# Patient Record
Sex: Female | Born: 1970
Health system: Southern US, Community
[De-identification: ages and names within clinical notes are randomized; demographics above are authoritative.]

## PROBLEM LIST (undated history)

## (undated) DIAGNOSIS — U071 COVID-19: Secondary | ICD-10-CM

## (undated) DIAGNOSIS — F53 Postpartum depression: Secondary | ICD-10-CM

## (undated) DIAGNOSIS — D649 Anemia, unspecified: Secondary | ICD-10-CM

## (undated) DIAGNOSIS — L719 Rosacea, unspecified: Secondary | ICD-10-CM

## (undated) HISTORY — DX: Postpartum depression: F53.0

## (undated) HISTORY — PX: APPENDECTOMY: SHX54

## (undated) HISTORY — PX: TUBAL LIGATION: SHX77

## (undated) HISTORY — DX: Rosacea, unspecified: L71.9

## (undated) HISTORY — DX: Anemia, unspecified: D64.9

## (undated) HISTORY — PX: HERNIA REPAIR: SHX51

---

## 2019-09-04 ENCOUNTER — Ambulatory Visit: Payer: Self-pay | Attending: Internal Medicine

## 2019-09-04 DIAGNOSIS — Z23 Encounter for immunization: Secondary | ICD-10-CM | POA: Insufficient documentation

## 2019-09-04 NOTE — Progress Notes (Signed)
   Covid-19 Vaccination Clinic  Name:  Tara Washington    MRN: [id number] DOB: 04/20/1971  09/04/2019  [name] was observed post Covid-19 immunization for 15 minutes without incident. She was provided with Vaccine Information Sheet and instruction to access the V-Safe system.   [name] was instructed to call 911 with any severe reactions post vaccine: Marland Kitchen Difficulty breathing  . [name]  . [name]  . [name]  . Dizziness and weakness   Immunizations Administered    Name Date Dose VIS Date Route   Pfizer COVID-19 Vaccine 09/04/2019  3:22 PM 0.3 mL 06/11/2019 Intramuscular   Manufacturer: [place]   Lot: [id number]   [place]: [id number]

## 2019-09-25 ENCOUNTER — Ambulatory Visit: Payer: Self-pay | Attending: Internal Medicine

## 2019-09-25 DIAGNOSIS — Z23 Encounter for immunization: Secondary | ICD-10-CM

## 2019-09-25 NOTE — Progress Notes (Signed)
   Covid-19 Vaccination Clinic  Name:  Tara Washington    MRN: [id number] DOB: [birth year]  09/25/2019  [name] was observed post Covid-19 immunization for 15 minutes without incident. She was provided with Vaccine Information Sheet and instruction to access the V-Safe system.   [name] was instructed to call 911 with any severe reactions post vaccine: Marland Kitchen Difficulty breathing  . [name]  . [name]  . [name]  . Dizziness and weakness   Immunizations Administered    Name Date Dose VIS Date Route   Pfizer COVID-19 Vaccine 09/25/2019 10:52 AM 0.3 mL 06/11/2019 Intramuscular   Manufacturer: [place]   Lot: [id number]   [place]: [id number]

## 2019-10-05 ENCOUNTER — Ambulatory Visit: Payer: Self-pay

## 2020-04-19 ENCOUNTER — Other Ambulatory Visit: Payer: Self-pay

## 2020-04-19 DIAGNOSIS — Z20822 Contact with and (suspected) exposure to covid-19: Secondary | ICD-10-CM

## 2020-04-20 LAB — NOVEL CORONAVIRUS, NAA: SARS-CoV-2, NAA: NOT DETECTED

## 2020-04-20 LAB — SARS-COV-2, NAA 2 DAY TAT

## 2020-09-15 ENCOUNTER — Inpatient Hospital Stay (HOSPITAL_BASED_OUTPATIENT_CLINIC_OR_DEPARTMENT_OTHER)
Admission: EM | Admit: 2020-09-15 | Discharge: 2020-09-21 | DRG: 342 | Disposition: A | Discharge status: Home / Self Care | Payer: Self-pay | Attending: Surgery | Admitting: Surgery

## 2020-09-15 ENCOUNTER — Other Ambulatory Visit: Payer: Self-pay

## 2020-09-15 ENCOUNTER — Encounter (HOSPITAL_BASED_OUTPATIENT_CLINIC_OR_DEPARTMENT_OTHER): Payer: Self-pay

## 2020-09-15 DIAGNOSIS — K660 Peritoneal adhesions (postprocedural) (postinfection): Secondary | ICD-10-CM | POA: Diagnosis present

## 2020-09-15 DIAGNOSIS — R3911 Hesitancy of micturition: Secondary | ICD-10-CM | POA: Diagnosis not present

## 2020-09-15 DIAGNOSIS — K353 Acute appendicitis with localized peritonitis, without perforation or gangrene: Principal | ICD-10-CM | POA: Diagnosis present

## 2020-09-15 DIAGNOSIS — K3580 Unspecified acute appendicitis: Secondary | ICD-10-CM | POA: Diagnosis present

## 2020-09-15 DIAGNOSIS — E739 Lactose intolerance, unspecified: Secondary | ICD-10-CM | POA: Diagnosis present

## 2020-09-15 DIAGNOSIS — Z9049 Acquired absence of other specified parts of digestive tract: Secondary | ICD-10-CM

## 2020-09-15 DIAGNOSIS — Z888 Allergy status to other drugs, medicaments and biological substances status: Secondary | ICD-10-CM

## 2020-09-15 DIAGNOSIS — Z880 Allergy status to penicillin: Secondary | ICD-10-CM

## 2020-09-15 DIAGNOSIS — Z20822 Contact with and (suspected) exposure to covid-19: Secondary | ICD-10-CM | POA: Diagnosis present

## 2020-09-15 DIAGNOSIS — D62 Acute posthemorrhagic anemia: Secondary | ICD-10-CM | POA: Diagnosis not present

## 2020-09-15 HISTORY — DX: COVID-19: U07.1

## 2020-09-15 LAB — COMPREHENSIVE METABOLIC PANEL
ALT: 12 U/L (ref 0–44)
AST: 12 U/L — ABNORMAL LOW (ref 15–41)
Albumin: 4.1 g/dL (ref 3.5–5.0)
Alkaline Phosphatase: 55 U/L (ref 38–126)
Anion gap: 9 (ref 5–15)
BUN: 14 mg/dL (ref 6–20)
CO2: 22 mmol/L (ref 22–32)
Calcium: 9.4 mg/dL (ref 8.9–10.3)
Chloride: 105 mmol/L (ref 98–111)
Creatinine, Ser: 0.6 mg/dL (ref 0.44–1.00)
GFR, Estimated: >60 mL/min (ref >60)
Glucose, Bld: 141 mg/dL — ABNORMAL HIGH (ref 70–99)
Potassium: 3.9 mmol/L (ref 3.5–5.1)
Sodium: 136 mmol/L (ref 135–145)
Total Bilirubin: 0.4 mg/dL (ref 0.3–1.2)
Total Protein: 6.9 g/dL (ref 6.5–8.1)

## 2020-09-15 LAB — CBC
HCT: 38.3 % (ref 36.0–46.0)
Hemoglobin: 12.5 g/dL (ref 12.0–15.0)
MCH: 28.5 pg (ref 26.0–34.0)
MCHC: 32.6 g/dL (ref 30.0–36.0)
MCV: 87.4 fL (ref 80.0–100.0)
Platelets: 398 10*3/uL (ref 150–400)
RBC: 4.38 MIL/uL (ref 3.87–5.11)
RDW: 13.8 % (ref 11.5–15.5)
WBC: 11.8 10*3/uL — ABNORMAL HIGH (ref 4.0–10.5)
nRBC: 0 % (ref 0.0–0.2)

## 2020-09-15 MED ORDER — MORPHINE SULFATE (PF) 4 MG/ML IV SOLN
4.0000 mg | Freq: Once | INTRAVENOUS | Status: DC
  Filled 2020-09-15: qty 1

## 2020-09-15 MED ORDER — ONDANSETRON HCL 4 MG/2ML IJ SOLN
4.0000 mg | Freq: Once | INTRAMUSCULAR | Status: DC
  Filled 2020-09-15: qty 2

## 2020-09-15 MED ORDER — SODIUM CHLORIDE 0.9 % IV BOLUS
1000.0000 mL | Freq: Once | INTRAVENOUS | Status: AC
  Administered 2020-09-15: 1000 mL via INTRAVENOUS

## 2020-09-15 NOTE — ED Provider Notes (Signed)
[facility] EMERGENCY DEPT Provider Note   CSN: [id number] Arrival date & time: 09/15/20  2240     History Chief Complaint  Patient presents with  . Abdominal Pain    Tara Washington is a 50 y.o. female.  Patient is a 50 year old female who presents with abdominal pain.  She has a history of a prior abdominal hernia repair in 2015.  She states that she had pain that began yesterday in her upper and periumbilical area but it subsided.  It started coming back again today and has been fairly constant since earlier this afternoon.  Its been worsening in intensity.  She says it now hurts pretty much all over her abdomen but a little bit more on the right upper and lower quadrants.  It does radiate to her mid back on the right.  She has had some nausea but no vomiting.  No fevers.  She did have a bowel movement today but still has a sensation that she needs to have a bowel movement.  She is not currently passing gas since earlier this morning.  She has some decreased urination and a little bit of burning on urination.  She had some similar symptoms in January and was seen by her PCP in the office.  She was supposed to have a CT scan at that time as of bowel obstruction was suspected but due to finances, she did not have this.  She said that she took Metamucil and drink some water with lemon in the mornings and after about 5 days, the pain subsided.  She said at that time she did not have any bowel movements or passing gas for 4 to 5 days.        History reviewed. No pertinent past medical history.  There are no problems to display for this patient.   History reviewed. No pertinent surgical history.   OB History   No obstetric history on file.     No family history on file.     Home Medications Prior to Admission medications   Not on File    Allergies    Penicillins  Review of Systems   Review of Systems  Constitutional: Negative for chills,  diaphoresis, fatigue and fever.  HENT: Negative for congestion, rhinorrhea and sneezing.   Eyes: Negative.   Respiratory: Negative for cough, chest tightness and shortness of breath.   Cardiovascular: Negative for chest pain and leg swelling.  Gastrointestinal: Positive for abdominal pain and nausea. Negative for blood in stool, diarrhea and vomiting.  Genitourinary: Positive for decreased urine volume. Negative for difficulty urinating, flank pain, frequency and hematuria.  Musculoskeletal: Negative for arthralgias and back pain.  Skin: Negative for rash.  Neurological: Negative for dizziness, speech difficulty, weakness, numbness and headaches.    Physical Exam Updated Vital Signs BP 121/74 (BP Location: Left Arm)   Pulse 81   Temp 98.3 F (36.8 C) (Oral)   Resp 20   Ht 5\' 3"  (1.6 m)   Wt 59 kg   SpO2 100%   BMI 23.03 kg/m   Physical Exam Constitutional:      Appearance: She is well-developed.  HENT:     Head: Normocephalic and atraumatic.  Eyes:     Pupils: Pupils are equal, round, and reactive to light.  Cardiovascular:     Rate and Rhythm: Normal rate and regular rhythm.     Heart sounds: Normal heart sounds.  Pulmonary:     Effort: Pulmonary effort is normal. No respiratory  distress.     Breath sounds: Normal breath sounds. No wheezing or rales.  Chest:     Chest wall: No tenderness.  Abdominal:     General: Bowel sounds are normal.     Palpations: Abdomen is soft.     Tenderness: There is generalized abdominal tenderness. There is no guarding or rebound.     Comments: Diffuse abdominal tenderness but more localized on the right side, it seems to be similar in the right upper and lower quadrants  Musculoskeletal:        General: Normal range of motion.     Cervical back: Normal range of motion and neck supple.  Lymphadenopathy:     Cervical: No cervical adenopathy.  Skin:    General: Skin is warm and dry.     Findings: No rash.  Neurological:     Mental  Status: She is alert and oriented to person, place, and time.     ED Results / Procedures / Treatments   Labs (all labs ordered are listed, but only abnormal results are displayed) Labs Reviewed  COMPREHENSIVE METABOLIC PANEL - Abnormal; Notable for the following components:      Result Value   Glucose, Bld 141 (*)    AST 12 (*)    All other components within normal limits  CBC - Abnormal; Notable for the following components:   WBC 11.8 (*)    All other components within normal limits  URINALYSIS, ROUTINE W REFLEX MICROSCOPIC - Abnormal; Notable for the following components:   Hgb urine dipstick TRACE (*)    Leukocytes,Ua SMALL (*)    Bacteria, UA MANY (*)    All other components within normal limits  RESP PANEL BY RT-PCR (FLU A&B, COVID) ARPGX2  PREGNANCY, URINE  LIPASE, BLOOD    EKG None  Radiology CT Abdomen Pelvis W Contrast  Result Date: 09/16/2020 CLINICAL DATA:  49 year old female with abdominal pain. Concern for bowel obstruction. EXAM: CT ABDOMEN AND PELVIS WITH CONTRAST TECHNIQUE: Multidetector CT imaging of the abdomen and pelvis was performed using the standard protocol following bolus administration of intravenous contrast. CONTRAST:  174mL OMNIPAQUE IOHEXOL 300 MG/ML  SOLN COMPARISON:  None. FINDINGS: Lower chest: Minimal bibasilar dependent atelectasis. Several small new nodules in the right middle lobe measure up to 3 mm, likely granuloma. The visualized lung bases are otherwise clear. No intra-abdominal free air. Small to moderate free fluid within the pelvis as well as in the left upper abdomen. Hepatobiliary: No focal liver abnormality is seen. No gallstones, gallbladder wall thickening, or biliary dilatation. Pancreas: Unremarkable. No pancreatic ductal dilatation or surrounding inflammatory changes. Spleen: Normal in size without focal abnormality. Adrenals/Urinary Tract: The adrenal glands unremarkable. The kidneys, visualized ureters, and urinary bladder appear  unremarkable. Stomach/Bowel: There is no bowel obstruction. The appendix is suboptimally visualized. A tubular structure arising from the base of the cecum medially and extending inferiorly likely represents the appendix. There is hyperenhancement of the appendiceal mucosa with thickened and dilatation of the appendix measuring up to 12 mm. There is inflammatory changes in the right lower quadrant and pericecal region. Findings most consistent with acute appendicitis. No drainable fluid collection or abscess. Vascular/Lymphatic: No significant vascular findings are present. No enlarged abdominal or pelvic lymph nodes. Reproductive: The uterus is anteverted. Small calcified uterine fibroids. The ovaries are suboptimally visualized. Mildly dilated tubular structures in the region of the left adnexa may represent hydrosalpinx. Ultrasound may provide better evaluation of the pelvic structures. Other: None Musculoskeletal: No acute or  significant osseous findings. IMPRESSION: 1. Findings most consistent with acute appendicitis. No drainable fluid collection or abscess. 2. Mildly dilated tubular structures in the region of the left adnexa may represent hydrosalpinx. Ultrasound may provide better evaluation of the pelvic structures. Electronically Signed   By: Anner Crete M.D.   On: 09/16/2020 01:22    Procedures Procedures   Medications Ordered in ED Medications  morphine 4 MG/ML injection 4 mg (4 mg Intravenous Patient Refused/Not Given 09/15/20 2338)  ondansetron (ZOFRAN) injection 4 mg (4 mg Intravenous Patient Refused/Not Given 09/15/20 2338)  cefTRIAXone (ROCEPHIN) 1 g in sodium chloride 0.9 % 100 mL IVPB (has no administration in time range)  metroNIDAZOLE (FLAGYL) IVPB 500 mg (has no administration in time range)  sodium chloride 0.9 % bolus 1,000 mL (0 mLs Intravenous Stopped 09/16/20 0131)  iohexol (OMNIPAQUE) 300 MG/ML solution 100 mL (100 mLs Intravenous Contrast Given 09/16/20 0025)    ED  Course  I have reviewed the triage vital signs and the nursing notes.  Pertinent labs & imaging results that were available during my care of the patient were reviewed by me and considered in my medical decision making (see chart for details).    MDM Rules/Calculators/A&P                          Patient is a 49 year old female who presents with abdominal pain.  There was some concern for obstruction given her prior surgery and decreased bowel movements.  However CT scan was performed which shows evidence of acute appendicitis.  Her WBC count is mildly elevated.  She was started on IV antibiotics.  I spoke with Dr. Georgette Dover who is excepted the patient for admission to [facility].  Plan is to do surgery in the morning.  Covid test is pending. Final Clinical Impression(s) / ED Diagnoses Final diagnoses:  Acute appendicitis with localized peritonitis, without perforation, abscess, or gangrene    Rx / DC Orders ED Discharge Orders    None       Malvin Johns, MD 09/16/20 [phone]

## 2020-09-15 NOTE — ED Triage Notes (Signed)
Pt Here POV from Home with ABD Pain.   Pain began yesterday but subsided and the Pain began again today mid-afternoon and has been worsening since. Pain was Mid-ABD and has progressed to RLQ.   No additional symptoms but patient has had decreased PO Intake since yesterday.   Ambulatory, GCS 15.

## 2020-09-16 ENCOUNTER — Encounter (HOSPITAL_BASED_OUTPATIENT_CLINIC_OR_DEPARTMENT_OTHER): Payer: Self-pay

## 2020-09-16 ENCOUNTER — Emergency Department (HOSPITAL_COMMUNITY): Payer: Self-pay | Admitting: Certified Registered Nurse Anesthetist

## 2020-09-16 ENCOUNTER — Encounter (HOSPITAL_COMMUNITY): Admission: EM | Disposition: A | Discharge status: Home / Self Care | Payer: Self-pay | Source: Home / Self Care

## 2020-09-16 ENCOUNTER — Emergency Department (HOSPITAL_BASED_OUTPATIENT_CLINIC_OR_DEPARTMENT_OTHER): Payer: Self-pay

## 2020-09-16 DIAGNOSIS — Z9049 Acquired absence of other specified parts of digestive tract: Secondary | ICD-10-CM

## 2020-09-16 DIAGNOSIS — K3580 Unspecified acute appendicitis: Secondary | ICD-10-CM | POA: Diagnosis present

## 2020-09-16 HISTORY — PX: LAPAROSCOPIC APPENDECTOMY: SHX408

## 2020-09-16 LAB — URINALYSIS, ROUTINE W REFLEX MICROSCOPIC
Bilirubin Urine: NEGATIVE
Glucose, UA: NEGATIVE mg/dL
Ketones, ur: NEGATIVE mg/dL
Nitrite: NEGATIVE
Protein, ur: NEGATIVE mg/dL
Specific Gravity, Urine: 1.026 (ref 1.005–1.030)
pH: 5 (ref 5.0–8.0)

## 2020-09-16 LAB — RESP PANEL BY RT-PCR (FLU A&B, COVID) ARPGX2
Influenza A by PCR: NEGATIVE
Influenza B by PCR: NEGATIVE
SARS Coronavirus 2 by RT PCR: NEGATIVE

## 2020-09-16 LAB — LIPASE, BLOOD: Lipase: 12 U/L (ref 11–51)

## 2020-09-16 LAB — PREGNANCY, URINE: Preg Test, Ur: NEGATIVE

## 2020-09-16 LAB — HIV ANTIBODY (ROUTINE TESTING W REFLEX): HIV Screen 4th Generation wRfx: NONREACTIVE

## 2020-09-16 SURGERY — APPENDECTOMY, LAPAROSCOPIC
Anesthesia: General | Site: Abdomen

## 2020-09-16 MED ORDER — GABAPENTIN 300 MG PO CAPS
300.0000 mg | ORAL_CAPSULE | Freq: Two times a day (BID) | ORAL | Status: DC
  Administered 2020-09-16 – 2020-09-20 (×10): 300 mg via ORAL
  Filled 2020-09-16 (×11): qty 1

## 2020-09-16 MED ORDER — OXYCODONE HCL 5 MG PO TABS: 5.0000 mg | ORAL_TABLET | Freq: Once | ORAL | Status: DC | PRN

## 2020-09-16 MED ORDER — PROPOFOL 10 MG/ML IV BOLUS
INTRAVENOUS | Status: DC | PRN
  Administered 2020-09-16: 140 mg via INTRAVENOUS

## 2020-09-16 MED ORDER — CHLORHEXIDINE GLUCONATE 0.12 % MT SOLN
OROMUCOSAL | Status: AC
  Administered 2020-09-16: 15 mL via OROMUCOSAL
  Filled 2020-09-16: qty 15

## 2020-09-16 MED ORDER — ACETAMINOPHEN 500 MG PO TABS
1000.0000 mg | ORAL_TABLET | Freq: Four times a day (QID) | ORAL | Status: DC
  Administered 2020-09-16 – 2020-09-19 (×12): 1000 mg via ORAL
  Filled 2020-09-16 (×16): qty 2

## 2020-09-16 MED ORDER — ACETAMINOPHEN 10 MG/ML IV SOLN: 1000.0000 mg | Freq: Once | INTRAVENOUS | Status: DC | PRN

## 2020-09-16 MED ORDER — ONDANSETRON HCL 4 MG/2ML IJ SOLN
INTRAMUSCULAR | Status: DC | PRN
  Administered 2020-09-16: 4 mg via INTRAVENOUS

## 2020-09-16 MED ORDER — OXYCODONE HCL 5 MG/5ML PO SOLN: 5.0000 mg | Freq: Once | ORAL | Status: DC | PRN

## 2020-09-16 MED ORDER — ACETAMINOPHEN 325 MG PO TABS: 650.0000 mg | ORAL_TABLET | Freq: Four times a day (QID) | ORAL | Status: DC | PRN

## 2020-09-16 MED ORDER — CHLORHEXIDINE GLUCONATE 0.12 % MT SOLN: 15.0000 mL | Freq: Once | OROMUCOSAL | Status: AC

## 2020-09-16 MED ORDER — MORPHINE SULFATE (PF) 2 MG/ML IV SOLN
2.0000 mg | INTRAVENOUS | Status: DC | PRN
  Administered 2020-09-16 (×2): 2 mg via INTRAVENOUS
  Filled 2020-09-16: qty 1
  Filled 2020-09-16: qty 2

## 2020-09-16 MED ORDER — FENTANYL CITRATE (PF) 250 MCG/5ML IJ SOLN
INTRAMUSCULAR | Status: AC
  Filled 2020-09-16: qty 5

## 2020-09-16 MED ORDER — IOHEXOL 300 MG/ML  SOLN
100.0000 mL | Freq: Once | INTRAMUSCULAR | Status: AC | PRN
  Administered 2020-09-16: 100 mL via INTRAVENOUS

## 2020-09-16 MED ORDER — ONDANSETRON HCL 4 MG/2ML IJ SOLN: 4.0000 mg | Freq: Four times a day (QID) | INTRAMUSCULAR | Status: DC | PRN

## 2020-09-16 MED ORDER — ALBUMIN HUMAN 5 % IV SOLN
INTRAVENOUS | Status: AC
  Filled 2020-09-16: qty 250

## 2020-09-16 MED ORDER — DIPHENHYDRAMINE HCL 25 MG PO CAPS: 25.0000 mg | ORAL_CAPSULE | Freq: Four times a day (QID) | ORAL | Status: DC | PRN

## 2020-09-16 MED ORDER — METRONIDAZOLE IN NACL 5-0.79 MG/ML-% IV SOLN
500.0000 mg | Freq: Three times a day (TID) | INTRAVENOUS | Status: DC
  Administered 2020-09-16 – 2020-09-21 (×15): 500 mg via INTRAVENOUS
  Filled 2020-09-16 (×15): qty 100

## 2020-09-16 MED ORDER — BUPIVACAINE-EPINEPHRINE 0.25% -1:200000 IJ SOLN
INTRAMUSCULAR | Status: DC | PRN
  Administered 2020-09-16: 12 mL

## 2020-09-16 MED ORDER — LIDOCAINE 2% (20 MG/ML) 5 ML SYRINGE
INTRAMUSCULAR | Status: DC | PRN
  Administered 2020-09-16: 60 mg via INTRAVENOUS

## 2020-09-16 MED ORDER — DOCUSATE SODIUM 100 MG PO CAPS
100.0000 mg | ORAL_CAPSULE | Freq: Two times a day (BID) | ORAL | Status: DC
  Administered 2020-09-16 – 2020-09-20 (×9): 100 mg via ORAL
  Filled 2020-09-16 (×10): qty 1

## 2020-09-16 MED ORDER — ONDANSETRON 4 MG PO TBDP: 4.0000 mg | ORAL_TABLET | Freq: Four times a day (QID) | ORAL | Status: DC | PRN

## 2020-09-16 MED ORDER — SODIUM CHLORIDE 0.9 % IV SOLN: INTRAVENOUS | Status: DC

## 2020-09-16 MED ORDER — MIDAZOLAM HCL 2 MG/2ML IJ SOLN
INTRAMUSCULAR | Status: DC | PRN
  Administered 2020-09-16: 2 mg via INTRAVENOUS

## 2020-09-16 MED ORDER — METRONIDAZOLE IN NACL 5-0.79 MG/ML-% IV SOLN
500.0000 mg | Freq: Once | INTRAVENOUS | Status: AC
  Administered 2020-09-16: 500 mg via INTRAVENOUS
  Filled 2020-09-16: qty 100

## 2020-09-16 MED ORDER — ONDANSETRON HCL 4 MG/2ML IJ SOLN
4.0000 mg | Freq: Four times a day (QID) | INTRAMUSCULAR | Status: DC | PRN
  Administered 2020-09-17 – 2020-09-18 (×2): 4 mg via INTRAVENOUS
  Filled 2020-09-16 (×2): qty 2

## 2020-09-16 MED ORDER — FENTANYL CITRATE (PF) 250 MCG/5ML IJ SOLN
INTRAMUSCULAR | Status: DC | PRN
  Administered 2020-09-16 (×3): 50 ug via INTRAVENOUS
  Administered 2020-09-16: 100 ug via INTRAVENOUS

## 2020-09-16 MED ORDER — OXYCODONE HCL 5 MG PO TABS
5.0000 mg | ORAL_TABLET | ORAL | Status: DC | PRN
  Administered 2020-09-16: 5 mg via ORAL
  Filled 2020-09-16: qty 1

## 2020-09-16 MED ORDER — METRONIDAZOLE IN NACL 5-0.79 MG/ML-% IV SOLN
500.0000 mg | Freq: Three times a day (TID) | INTRAVENOUS | Status: DC
  Administered 2020-09-16: 500 mg via INTRAVENOUS
  Filled 2020-09-16: qty 100

## 2020-09-16 MED ORDER — ROCURONIUM BROMIDE 10 MG/ML (PF) SYRINGE
PREFILLED_SYRINGE | INTRAVENOUS | Status: DC | PRN
  Administered 2020-09-16: 50 mg via INTRAVENOUS

## 2020-09-16 MED ORDER — KETOROLAC TROMETHAMINE 15 MG/ML IJ SOLN: 15.0000 mg | Freq: Four times a day (QID) | INTRAMUSCULAR | Status: DC | PRN

## 2020-09-16 MED ORDER — DIPHENHYDRAMINE HCL 50 MG/ML IJ SOLN: 25.0000 mg | Freq: Four times a day (QID) | INTRAMUSCULAR | Status: DC | PRN

## 2020-09-16 MED ORDER — SODIUM CHLORIDE 0.9 % IR SOLN
Status: DC | PRN
  Administered 2020-09-16: 1000 mL

## 2020-09-16 MED ORDER — BISACODYL 10 MG RE SUPP: 10.0000 mg | Freq: Every day | RECTAL | Status: DC | PRN

## 2020-09-16 MED ORDER — MIDAZOLAM HCL 2 MG/2ML IJ SOLN
INTRAMUSCULAR | Status: AC
  Filled 2020-09-16: qty 2

## 2020-09-16 MED ORDER — TRAMADOL HCL 50 MG PO TABS
50.0000 mg | ORAL_TABLET | Freq: Four times a day (QID) | ORAL | Status: DC | PRN
  Administered 2020-09-17 – 2020-09-18 (×3): 50 mg via ORAL
  Filled 2020-09-16 (×3): qty 1

## 2020-09-16 MED ORDER — METHOCARBAMOL 500 MG PO TABS: 500.0000 mg | ORAL_TABLET | Freq: Four times a day (QID) | ORAL | Status: DC | PRN

## 2020-09-16 MED ORDER — PHENYLEPHRINE HCL-NACL 10-0.9 MG/250ML-% IV SOLN
INTRAVENOUS | Status: DC | PRN
  Administered 2020-09-16: 40 ug/min via INTRAVENOUS

## 2020-09-16 MED ORDER — DEXAMETHASONE SODIUM PHOSPHATE 10 MG/ML IJ SOLN
INTRAMUSCULAR | Status: DC | PRN
  Administered 2020-09-16: 5 mg via INTRAVENOUS

## 2020-09-16 MED ORDER — SODIUM CHLORIDE 0.9 % IV SOLN
2.0000 g | INTRAVENOUS | Status: DC
  Filled 2020-09-16 (×2): qty 20

## 2020-09-16 MED ORDER — PROMETHAZINE HCL 25 MG/ML IJ SOLN
6.2500 mg | INTRAMUSCULAR | Status: DC | PRN
Start: 2020-09-16 — End: 2020-09-16

## 2020-09-16 MED ORDER — ACETAMINOPHEN 500 MG PO TABS
1000.0000 mg | ORAL_TABLET | Freq: Four times a day (QID) | ORAL | Status: DC
  Administered 2020-09-16: 1000 mg via ORAL
  Filled 2020-09-16: qty 2

## 2020-09-16 MED ORDER — SUGAMMADEX SODIUM 200 MG/2ML IV SOLN
INTRAVENOUS | Status: DC | PRN
  Administered 2020-09-16: 200 mg via INTRAVENOUS

## 2020-09-16 MED ORDER — AMISULPRIDE (ANTIEMETIC) 5 MG/2ML IV SOLN: 10.0000 mg | Freq: Once | INTRAVENOUS | Status: DC | PRN

## 2020-09-16 MED ORDER — ENOXAPARIN SODIUM 40 MG/0.4ML ~~LOC~~ SOLN: 40.0000 mg | SUBCUTANEOUS | Status: DC

## 2020-09-16 MED ORDER — LACTATED RINGERS IV SOLN: INTRAVENOUS | Status: DC

## 2020-09-16 MED ORDER — SODIUM CHLORIDE 0.9 % IV SOLN
2.0000 g | INTRAVENOUS | Status: DC
  Administered 2020-09-16 – 2020-09-20 (×5): 2 g via INTRAVENOUS
  Filled 2020-09-16 (×6): qty 20

## 2020-09-16 MED ORDER — HYDROMORPHONE HCL 1 MG/ML IJ SOLN
0.2500 mg | INTRAMUSCULAR | Status: DC | PRN
Start: 2020-09-16 — End: 2020-09-16

## 2020-09-16 MED ORDER — HYDROMORPHONE HCL 1 MG/ML IJ SOLN
0.5000 mg | INTRAMUSCULAR | Status: DC | PRN
  Administered 2020-09-16: 0.5 mg via INTRAVENOUS
  Filled 2020-09-16: qty 1

## 2020-09-16 MED ORDER — 0.9 % SODIUM CHLORIDE (POUR BTL) OPTIME
TOPICAL | Status: DC | PRN
  Administered 2020-09-16: 1000 mL

## 2020-09-16 MED ORDER — ACETAMINOPHEN 650 MG RE SUPP: 650.0000 mg | Freq: Four times a day (QID) | RECTAL | Status: DC | PRN

## 2020-09-16 MED ORDER — POTASSIUM CHLORIDE IN NACL 20-0.9 MEQ/L-% IV SOLN
INTRAVENOUS | Status: DC
  Filled 2020-09-16: qty 1000

## 2020-09-16 MED ORDER — SODIUM CHLORIDE 0.9 % IV SOLN
1.0000 g | Freq: Once | INTRAVENOUS | Status: AC
  Administered 2020-09-16: 1 g via INTRAVENOUS
  Filled 2020-09-16: qty 10

## 2020-09-16 MED ORDER — ONDANSETRON 4 MG PO TBDP
4.0000 mg | ORAL_TABLET | Freq: Four times a day (QID) | ORAL | Status: DC | PRN
  Administered 2020-09-19: 4 mg via ORAL
  Filled 2020-09-16: qty 1

## 2020-09-16 MED ORDER — PHENYLEPHRINE 40 MCG/ML (10ML) SYRINGE FOR IV PUSH (FOR BLOOD PRESSURE SUPPORT)
PREFILLED_SYRINGE | INTRAVENOUS | Status: DC | PRN
  Administered 2020-09-16: 80 ug via INTRAVENOUS
  Administered 2020-09-16: 120 ug via INTRAVENOUS

## 2020-09-16 MED ORDER — ALBUMIN HUMAN 5 % IV SOLN
12.5000 g | Freq: Once | INTRAVENOUS | Status: AC
  Administered 2020-09-16: 12.5 g via INTRAVENOUS

## 2020-09-16 SURGICAL SUPPLY — 37 items
APPLIER CLIP 5 13 M/L LIGAMAX5 (MISCELLANEOUS)
BLADE CLIPPER SURG (BLADE) IMPLANT
CANISTER SUCT 3000ML PPV (MISCELLANEOUS) ×2 IMPLANT
CHLORAPREP W/TINT 26 (MISCELLANEOUS) ×2 IMPLANT
CLIP APPLIE 5 13 M/L LIGAMAX5 (MISCELLANEOUS) IMPLANT
COVER SURGICAL LIGHT HANDLE (MISCELLANEOUS) ×2 IMPLANT
CUTTER FLEX LINEAR 45M (STAPLE) ×2 IMPLANT
DERMABOND ADVANCED (GAUZE/BANDAGES/DRESSINGS) ×1
DERMABOND ADVANCED .7 DNX12 (GAUZE/BANDAGES/DRESSINGS) ×1 IMPLANT
ELECT REM PT RETURN 9FT ADLT (ELECTROSURGICAL) ×2
ELECTRODE REM PT RTRN 9FT ADLT (ELECTROSURGICAL) ×1 IMPLANT
GLOVE BIO SURGEON STRL SZ 6 (GLOVE) ×6 IMPLANT
GLOVE SURG UNDER LTX SZ6.5 (GLOVE) ×4 IMPLANT
GOWN STRL REUS W/ TWL LRG LVL3 (GOWN DISPOSABLE) ×3 IMPLANT
GOWN STRL REUS W/TWL LRG LVL3 (GOWN DISPOSABLE) ×3
GRASPER SUT TROCAR 14GX15 (MISCELLANEOUS) ×2 IMPLANT
KIT BASIN OR (CUSTOM PROCEDURE TRAY) ×2 IMPLANT
KIT TURNOVER KIT B (KITS) ×2 IMPLANT
NEEDLE INSUFFLATION 14GA 120MM (NEEDLE) ×2 IMPLANT
NS IRRIG 1000ML POUR BTL (IV SOLUTION) ×2 IMPLANT
PAD ARMBOARD 7.5X6 YLW CONV (MISCELLANEOUS) ×4 IMPLANT
POUCH SPECIMEN RETRIEVAL 10MM (ENDOMECHANICALS) ×2 IMPLANT
RELOAD 45 VASCULAR/THIN (ENDOMECHANICALS) ×2 IMPLANT
RELOAD STAPLE TA45 3.5 REG BLU (ENDOMECHANICALS) IMPLANT
SCISSORS LAP 5X35 DISP (ENDOMECHANICALS) IMPLANT
SET IRRIG TUBING LAPAROSCOPIC (IRRIGATION / IRRIGATOR) ×2 IMPLANT
SET TUBE SMOKE EVAC HIGH FLOW (TUBING) ×2 IMPLANT
SHEARS HARMONIC ACE PLUS 36CM (ENDOMECHANICALS) ×2 IMPLANT
SLEEVE ENDOPATH XCEL 5M (ENDOMECHANICALS) ×2 IMPLANT
SPECIMEN JAR SMALL (MISCELLANEOUS) ×2 IMPLANT
SUT MNCRL AB 4-0 PS2 18 (SUTURE) ×2 IMPLANT
TOWEL GREEN STERILE FF (TOWEL DISPOSABLE) ×2 IMPLANT
TRAY FOLEY W/BAG SLVR 16FR (SET/KITS/TRAYS/PACK) ×1
TRAY FOLEY W/BAG SLVR 16FR ST (SET/KITS/TRAYS/PACK) ×1 IMPLANT
TRAY LAPAROSCOPIC MC (CUSTOM PROCEDURE TRAY) ×2 IMPLANT
TROCAR XCEL 12X100 BLDLESS (ENDOMECHANICALS) ×2 IMPLANT
TROCAR XCEL NON-BLD 5MMX100MML (ENDOMECHANICALS) ×2 IMPLANT

## 2020-09-16 NOTE — Progress Notes (Signed)
Post-op check- She is still fairly sleepy this afternoon. Reports her pain is better than before surgery.  Afebrile, HR 85, BP slightly soft   Briefly updated her as to intraop findings and plan to keep her overnight. High risk for ileus or delayed abscess.  Continue advancing diet as able, mobilize.  Romana Juniper, MD [facility], [place]

## 2020-09-16 NOTE — Transfer of Care (Signed)
Immediate Anesthesia Transfer of Care Note  Patient: Tara Washington  Procedure(s) Performed: APPENDECTOMY LAPAROSCOPIC (N/A Abdomen)  Patient Location: PACU  Anesthesia Type:General  Level of Consciousness: drowsy  Airway & Oxygen Therapy: Patient Spontanous Breathing and Patient connected to face mask oxygen  Post-op Assessment: Report given to RN and Post -op Vital signs reviewed and stable  Post vital signs: Reviewed and stable  Last Vitals:  Vitals Value Taken Time  BP 93/52 09/16/20 1049  Temp 36.3 C 09/16/20 1046  Pulse 87 09/16/20 1051  Resp 16 09/16/20 1051  SpO2 100 % 09/16/20 1051  Vitals shown include unvalidated device data.  Last Pain:  Vitals:   09/16/20 0739  TempSrc:   PainSc: 8       Patients Stated Pain Goal: 1 (64/84/72 0721)  Complications: No complications documented.

## 2020-09-16 NOTE — ED Notes (Signed)
Patient returned from CT at this time.

## 2020-09-16 NOTE — Anesthesia Preprocedure Evaluation (Signed)
Anesthesia Evaluation  Patient identified by MRN, date of birth, ID band Patient awake    Reviewed: Allergy & Precautions, NPO status , Patient's Chart, lab work & pertinent test results  Airway Mallampati: II  TM Distance: >3 FB Neck ROM: Full    Dental  (+) Teeth Intact   Pulmonary neg pulmonary ROS,    Pulmonary exam normal        Cardiovascular negative cardio ROS   Rhythm:Regular Rate:Normal     Neuro/Psych negative neurological ROS  negative psych ROS   GI/Hepatic Neg liver ROS, Acute appendicitis    Endo/Other  negative endocrine ROS  Renal/GU negative Renal ROS  negative genitourinary   Musculoskeletal negative musculoskeletal ROS (+)   Abdominal (+)  Abdomen: soft. Bowel sounds: normal.  Peds  Hematology negative hematology ROS (+)   Anesthesia Other Findings   Reproductive/Obstetrics                             Anesthesia Physical Anesthesia Plan  ASA: I  Anesthesia Plan: General   Post-op Pain Management:    Induction: Intravenous  PONV Risk Score and Plan: 3 and Ondansetron, Dexamethasone, Treatment may vary due to age or medical condition and Midazolam  Airway Management Planned: Mask and Oral ETT  Additional Equipment: None  Intra-op Plan:   Post-operative Plan: Extubation in OR  Informed Consent: I have reviewed the patients History and Physical, chart, labs and discussed the procedure including the risks, benefits and alternatives for the proposed anesthesia with the patient or authorized representative who has indicated his/her understanding and acceptance.     Dental advisory given  Plan Discussed with: CRNA  Anesthesia Plan Comments: (Lab Results      Component                Value               Date                      WBC                      11.8 (H)            09/15/2020                HGB                      12.5                09/15/2020                 HCT                      38.3                09/15/2020                MCV                      87.4                09/15/2020                PLT                      398  09/15/2020           Lab Results      Component                Value               Date                      NA                       136                 09/15/2020                K                        3.9                 09/15/2020                CO2                      22                  09/15/2020                GLUCOSE                  141 (H)             09/15/2020                BUN                      14                  09/15/2020                CREATININE               0.60                09/15/2020                CALCIUM                  9.4                 09/15/2020                GFRNONAA                 >60                 09/15/2020          )        Anesthesia Quick Evaluation

## 2020-09-16 NOTE — OR Nursing (Signed)
HARMONIC  REF#[id number], LOT# [id number], EXP. 03/30/25 IN USE; ETHICON ENDOPATH ETS FLEX #[id number], [id number], EXP. 07/31/25 IN USE.

## 2020-09-16 NOTE — Anesthesia Procedure Notes (Signed)
Procedure Name: Intubation Date/Time: 09/16/2020 9:23 AM Performed by: Reece Agar, CRNA Pre-anesthesia Checklist: Patient identified, Emergency Drugs available, Suction available and Patient being monitored Patient Re-evaluated:Patient Re-evaluated prior to induction Oxygen Delivery Method: Circle System Utilized Preoxygenation: Pre-oxygenation with 100% oxygen Induction Type: IV induction Ventilation: Mask ventilation without difficulty Laryngoscope Size: Mac and 3 Tube type: Oral Tube size: 7.0 mm Number of attempts: 1 Airway Equipment and Method: Stylet Placement Confirmation: ETT inserted through vocal cords under direct vision,  positive ETCO2 and breath sounds checked- equal and bilateral Secured at: 21 cm Tube secured with: Tape Dental Injury: Teeth and Oropharynx as per pre-operative assessment

## 2020-09-16 NOTE — Anesthesia Postprocedure Evaluation (Signed)
Anesthesia Post Note  Patient: Tara Washington  Procedure(s) Performed: APPENDECTOMY LAPAROSCOPIC (N/A Abdomen)     Patient location during evaluation: PACU Anesthesia Type: General Level of consciousness: awake and alert Pain management: pain level controlled Vital Signs Assessment: post-procedure vital signs reviewed and stable Respiratory status: spontaneous breathing, nonlabored ventilation, respiratory function stable and patient connected to nasal cannula oxygen Cardiovascular status: blood pressure returned to baseline and stable Postop Assessment: no apparent nausea or vomiting Anesthetic complications: no   No complications documented.  Last Vitals:  Vitals:   09/16/20 1145 09/16/20 1200  BP: (!) 85/55 (!) 89/65  Pulse: 84 85  Resp: 14 15  Temp:  36.4 C  SpO2: 98% 98%    Last Pain:  Vitals:   09/16/20 1451  TempSrc:   PainSc: 6                  Signora Zucco P Cai Flott

## 2020-09-16 NOTE — H&P (Signed)
Tara Washington is an 50 y.o. female.   Chief Complaint: Abdominal pain HPI: This is a 50 year old female who presented to [facility] with two days of abdominal pain, worse on the right side.  Some nausea/ no vomiting.  No urinary symptoms.  CT scan revealed findings consistent with acute appendicitis.  She is being transferred to [facility] for management.    COVID test pending.  History reviewed. No pertinent past medical history.  History reviewed. No pertinent surgical history.  No family history on file. Social History:  has no history on file for tobacco use, alcohol use, and drug use.  Allergies:  Allergies  Allergen Reactions  . Penicillins    Prior to Admission medications   Not on File     Results for orders placed or performed during the hospital encounter of 09/15/20 (from the past 48 hour(s))  Comprehensive metabolic panel     Status: Abnormal   Collection Time: 09/15/20 11:02 PM  Result Value Ref Range   Sodium 136 135 - 145 mmol/L   Potassium 3.9 3.5 - 5.1 mmol/L   Chloride 105 98 - 111 mmol/L   CO2 22 22 - 32 mmol/L   Glucose, Bld 141 (H) 70 - 99 mg/dL    Comment: Glucose reference range applies only to samples taken after fasting for at least 8 hours.   BUN 14 6 - 20 mg/dL   Creatinine, Ser 0.60 0.44 - 1.00 mg/dL   Calcium 9.4 8.9 - 10.3 mg/dL   Total Protein 6.9 6.5 - 8.1 g/dL   Albumin 4.1 3.5 - 5.0 g/dL   AST 12 (L) 15 - 41 U/L   ALT 12 0 - 44 U/L   Alkaline Phosphatase 55 38 - 126 U/L   Total Bilirubin 0.4 0.3 - 1.2 mg/dL   GFR, Estimated >60 >60 mL/min    Comment: (NOTE) Calculated using the CKD-EPI Creatinine Equation (2021)    Anion gap 9 5 - 15    Comment: Performed at [facility]  CBC     Status: Abnormal   Collection Time: 09/15/20 11:02 PM  Result Value Ref Range   WBC 11.8 (H) 4.0 - 10.5 K/uL   RBC 4.38 3.87 - 5.11 MIL/uL   Hemoglobin 12.5 12.0 - 15.0 g/dL   HCT 38.3 36.0 - 46.0 %   MCV 87.4 80.0 -  100.0 fL   MCH 28.5 26.0 - 34.0 pg   MCHC 32.6 30.0 - 36.0 g/dL   RDW 13.8 11.5 - 15.5 %   Platelets 398 150 - 400 K/uL   nRBC 0.0 0.0 - 0.2 %    Comment: Performed at [facility]  Urinalysis, Routine w reflex microscopic Urine, Clean Catch     Status: Abnormal   Collection Time: 09/15/20 11:02 PM  Result Value Ref Range   Color, Urine YELLOW YELLOW   APPearance CLEAR CLEAR   Specific Gravity, Urine 1.026 1.005 - 1.030   pH 5.0 5.0 - 8.0   Glucose, UA NEGATIVE NEGATIVE mg/dL   Hgb urine dipstick TRACE (A) NEGATIVE   Bilirubin Urine NEGATIVE NEGATIVE   Ketones, ur NEGATIVE NEGATIVE mg/dL   Protein, ur NEGATIVE NEGATIVE mg/dL   Nitrite NEGATIVE NEGATIVE   Leukocytes,Ua SMALL (A) NEGATIVE   RBC / HPF 0-5 0 - 5 RBC/hpf   WBC, UA 11-20 0 - 5 WBC/hpf   Bacteria, UA MANY (A) NONE SEEN   Squamous Epithelial / LPF 0-5 0 - 5   Mucus  PRESENT     Comment: Performed at [facility]  Lipase, blood     Status: None   Collection Time: 09/15/20 11:02 PM  Result Value Ref Range   Lipase 12 11 - 51 U/L    Comment: Performed at [facility]  Pregnancy, urine     Status: None   Collection Time: 09/15/20 11:21 PM  Result Value Ref Range   Preg Test, Ur NEGATIVE NEGATIVE    Comment: Performed at [facility]   CT Abdomen Pelvis W Contrast  Result Date: 09/16/2020 CLINICAL DATA:  50 year old female with abdominal pain. Concern for bowel obstruction. EXAM: CT ABDOMEN AND PELVIS WITH CONTRAST TECHNIQUE: Multidetector CT imaging of the abdomen and pelvis was performed using the standard protocol following bolus administration of intravenous contrast. CONTRAST:  115mL OMNIPAQUE IOHEXOL 300 MG/ML  SOLN COMPARISON:  None. FINDINGS: Lower chest: Minimal bibasilar dependent atelectasis. Several small new nodules in the right middle lobe measure up to 3 mm, likely granuloma. The visualized lung bases are otherwise clear. No intra-abdominal  free air. Small to moderate free fluid within the pelvis as well as in the left upper abdomen. Hepatobiliary: No focal liver abnormality is seen. No gallstones, gallbladder wall thickening, or biliary dilatation. Pancreas: Unremarkable. No pancreatic ductal dilatation or surrounding inflammatory changes. Spleen: Normal in size without focal abnormality. Adrenals/Urinary Tract: The adrenal glands unremarkable. The kidneys, visualized ureters, and urinary bladder appear unremarkable. Stomach/Bowel: There is no bowel obstruction. The appendix is suboptimally visualized. A tubular structure arising from the base of the cecum medially and extending inferiorly likely represents the appendix. There is hyperenhancement of the appendiceal mucosa with thickened and dilatation of the appendix measuring up to 12 mm. There is inflammatory changes in the right lower quadrant and pericecal region. Findings most consistent with acute appendicitis. No drainable fluid collection or abscess. Vascular/Lymphatic: No significant vascular findings are present. No enlarged abdominal or pelvic lymph nodes. Reproductive: The uterus is anteverted. Small calcified uterine fibroids. The ovaries are suboptimally visualized. Mildly dilated tubular structures in the region of the left adnexa may represent hydrosalpinx. Ultrasound may provide better evaluation of the pelvic structures. Other: None Musculoskeletal: No acute or significant osseous findings. IMPRESSION: 1. Findings most consistent with acute appendicitis. No drainable fluid collection or abscess. 2. Mildly dilated tubular structures in the region of the left adnexa may represent hydrosalpinx. Ultrasound may provide better evaluation of the pelvic structures. Electronically Signed   By: Anner Crete M.D.   On: 09/16/2020 01:22    Review of Systems Constitutional: Negative for chills, diaphoresis, fatigue and fever.  HENT: Negative for congestion, rhinorrhea and sneezing.    Eyes: Negative.   Respiratory: Negative for cough, chest tightness and shortness of breath.   Cardiovascular: Negative for chest pain and leg swelling.  Gastrointestinal: Positive for abdominal pain and nausea. Negative for blood in stool, diarrhea and vomiting.  Genitourinary: Positive for decreased urine volume. Negative for difficulty urinating, flank pain, frequency and hematuria.  Musculoskeletal: Negative for arthralgias and back pain.  Skin: Negative for rash.  Neurological: Negative for dizziness, speech difficulty, weakness, numbness and headaches.    Blood pressure 103/79, pulse 87, temperature 98.3 F (36.8 C), temperature source Oral, resp. rate 20, height 5\' 3"  (1.6 m), weight 59 kg, SpO2 98 %. Physical Exam  Constitutional:  WDWN in NAD, conversant, no obvious deformities; lying in bed comfortably Eyes:  Pupils equal, round; sclera anicteric; moist conjunctiva; no lid lag HENT:  Oral  mucosa moist; good dentition  Neck:  No masses palpated, trachea midline; no thyromegaly Lungs:  CTA bilaterally; normal respiratory effort CV:  Regular rate and rhythm; no murmurs; extremities well-perfused with no edema Abd:  +bowel sounds, soft, Diffuse abdominal tenderness but more localized on the right side, it seems to be similar in the right upper and lower quadrants  no palpable organomegaly; no palpable hernias Musc:  Unable to assess gait; no apparent clubbing or cyanosis in extremities Lymphatic:  No palpable cervical or axillary lymphadenopathy Skin:  Warm, dry; no sign of jaundice Psychiatric - alert and oriented x 4; calm mood and affect  Assessment/Plan Acute appendicitis  COVID test, then transfer to [place] laparoscopic appendectomy in AM by Dr. Kae Heller.  She will discuss the surgery further with the patient in the morning.  Maia Petties, MD 09/16/2020, 1:58 AM

## 2020-09-16 NOTE — Progress Notes (Signed)
Day of Surgery   Subjective/Chief Complaint: Notes ongoing pain, worsening. Mostly on the right side but the whole lower abdomen is uncomfortable.    Objective: Vital signs in last 24 hours: Temp:  [98.1 F (36.7 C)-99.1 F (37.3 C)] 99.1 F (37.3 C) (03/19 0707) Pulse Rate:  [81-101] 101 (03/19 0707) Resp:  [10-20] 18 (03/19 0652) BP: (103-124)/(66-84) 107/67 (03/19 0707) SpO2:  [96 %-100 %] 97 % (03/19 0707) Weight:  [59 kg] 59 kg (03/18 2251)    Intake/Output from previous day: 03/18 0701 - 03/19 0700 In: 1200 [IV Piggyback:1200] Out: -  Intake/Output this shift: No intake/output data recorded.  General appearance: alert, cooperative and appears uncomfortable Resp: clear to auscultation bilaterally Cardio: regular rate and rhythm GI: soft, nondistended. Diffusely tender with involuntary guarding in the right lower quadrant. Well healed incision at the umbilicus.  Extremities: extremities normal, atraumatic, no cyanosis or edema Skin: Skin color, texture, turgor normal. No rashes or lesions Neurologic: Grossly normal  Lab Results:  Recent Labs    09/15/20 2302  WBC 11.8*  HGB 12.5  HCT 38.3  PLT 398   BMET Recent Labs    09/15/20 2302  NA 136  K 3.9  CL 105  CO2 22  GLUCOSE 141*  BUN 14  CREATININE 0.60  CALCIUM 9.4   PT/INR No results for input(s): LABPROT, INR in the last 72 hours. ABG No results for input(s): PHART, HCO3 in the last 72 hours.  Invalid input(s): PCO2, PO2  Studies/Results: CT Abdomen Pelvis W Contrast  Result Date: 09/16/2020 CLINICAL DATA:  50 year old female with abdominal pain. Concern for bowel obstruction. EXAM: CT ABDOMEN AND PELVIS WITH CONTRAST TECHNIQUE: Multidetector CT imaging of the abdomen and pelvis was performed using the standard protocol following bolus administration of intravenous contrast. CONTRAST:  178mL OMNIPAQUE IOHEXOL 300 MG/ML  SOLN COMPARISON:  None. FINDINGS: Lower chest: Minimal bibasilar dependent  atelectasis. Several small new nodules in the right middle lobe measure up to 3 mm, likely granuloma. The visualized lung bases are otherwise clear. No intra-abdominal free air. Small to moderate free fluid within the pelvis as well as in the left upper abdomen. Hepatobiliary: No focal liver abnormality is seen. No gallstones, gallbladder wall thickening, or biliary dilatation. Pancreas: Unremarkable. No pancreatic ductal dilatation or surrounding inflammatory changes. Spleen: Normal in size without focal abnormality. Adrenals/Urinary Tract: The adrenal glands unremarkable. The kidneys, visualized ureters, and urinary bladder appear unremarkable. Stomach/Bowel: There is no bowel obstruction. The appendix is suboptimally visualized. A tubular structure arising from the base of the cecum medially and extending inferiorly likely represents the appendix. There is hyperenhancement of the appendiceal mucosa with thickened and dilatation of the appendix measuring up to 12 mm. There is inflammatory changes in the right lower quadrant and pericecal region. Findings most consistent with acute appendicitis. No drainable fluid collection or abscess. Vascular/Lymphatic: No significant vascular findings are present. No enlarged abdominal or pelvic lymph nodes. Reproductive: The uterus is anteverted. Small calcified uterine fibroids. The ovaries are suboptimally visualized. Mildly dilated tubular structures in the region of the left adnexa may represent hydrosalpinx. Ultrasound may provide better evaluation of the pelvic structures. Other: None Musculoskeletal: No acute or significant osseous findings. IMPRESSION: 1. Findings most consistent with acute appendicitis. No drainable fluid collection or abscess. 2. Mildly dilated tubular structures in the region of the left adnexa may represent hydrosalpinx. Ultrasound may provide better evaluation of the pelvic structures. Electronically Signed   By: Anner Crete M.D.   On:  09/16/2020 01:22    Anti-infectives: Anti-infectives (From admission, onward)   Start     Dose/Rate Route Frequency Ordered Stop   09/16/20 1000  cefTRIAXone (ROCEPHIN) 2 g in sodium chloride 0.9 % 100 mL IVPB       "And" Linked Group Details   2 g 200 mL/hr over 30 Minutes Intravenous Every 24 hours 09/16/20 0203     09/16/20 1000  metroNIDAZOLE (FLAGYL) IVPB 500 mg       "And" Linked Group Details   500 mg 100 mL/hr over 60 Minutes Intravenous Every 8 hours 09/16/20 0203     09/16/20 0145  cefTRIAXone (ROCEPHIN) 1 g in sodium chloride 0.9 % 100 mL IVPB        1 g 200 mL/hr over 30 Minutes Intravenous  Once 09/16/20 0130 09/16/20 0216   09/16/20 0145  metroNIDAZOLE (FLAGYL) IVPB 500 mg        500 mg 100 mL/hr over 60 Minutes Intravenous  Once 09/16/20 0130 09/16/20 0324      Assessment/Plan:  I have reviewed her chart, imaging studies and labs. CT consistent with appendicitis, there is a fair amount of free fluid in the pelvis and in the left upper quadrant. I recommend proceeding with laparoscopic appendectomy. We discussed the surgery including risks of bleeding, infection, pain, scarring, injury to intra-abdominal structures, conversion to open surgery or more extensive resection, risk of staple line leak or delayed abscess, failure to resolve symptoms, postoperative ileus, incisional hernia, as well as general risks of DVT/PE, pneumonia, stroke, heart attack, death. Questions were welcomed and answered to the patient's satisfaction. We'll proceed to the operating room today.    LOS: 0 days    Clovis Riley 09/16/2020

## 2020-09-16 NOTE — Op Note (Signed)
Operative Report  Tara Washington 50 y.o. female  [id number]  [id number]  09/16/2020  Surgeon: Romana Juniper MD   Procedure performed: Laparoscopic Appendectomy  Preop diagnosis: Acute appendicitis  Post-op diagnosis/intraop findings: Acute appendicitis with diffuse pus throughout the abdomen/purulent peritonitis, adhesions noted between the posterior uterus/right ovary and rectum but no obvious tubo-ovarian abscess  Specimens: appendix  EBL: minimal  Complications: none  Description of procedure: After obtaining informed consent the patient was brought to the operating room. Antibiotics and subcutaneous heparin were administered. SCD's were applied. General endotracheal anesthesia was initiated and a formal time-out was performed.  Foley catheter was inserted which is removed in the case.  The abdomen was prepped and draped in the usual sterile fashion and the abdomen was entered using a left subcostal Veress needle and insufflated to 15 mmHg.  A left lower quadrant 12 mm trocar and camera were then introduced, the abdomen was inspected and there is no evidence of injury from our entry.  Omental adhesions along the umbilicus from previous hernia repair noted, there is no visible mesh.    A suprapubic and infraumbilical 5 mm trocar were introduced under direct visualization following infiltration with local. There is moderate volume purulent fluid over the liver as well as in the left upper quadrant and in the pelvis which was aspirated. The patient was then placed in Trendelenburg and rotated to the left and the small bowel was reflected cephalad.  Inflammatory adhesions between the small bowel/ligament of Treves and the appendix were carefully bluntly lysed until the appendix was well visualized.  This was partially retrocecal, the distal half did appear inflamed but there was no gangrene or perforation. A combination of blunt dissection and harmonic scalpel were used to  free it of its retroperitoneal attachments. Great care was taken to ensure no injury to surrounding retroperitoneal structures, cecum or terminal ileum.  The mesoappendix was divided with the harmonic scalpel skeletonizing the appendix down to its junction with the cecum.  A white load 45 mm Endo GIA stapler was then used to transect the appendix from the cecum, no residual appendix was left.  Hemostasis was ensured. The appendix was placed in an Endo Catch bag and removed through our 12 mm trocar site.  The abdomen was once again surveyed.  The small bowel was run proximally several feet and there was no other abnormality identified.  Remaining purulent fluid was aspirated and the abdomen was irrigated slightly to ensure all purulent pockets were evacuated.  The uterus appears slightly enlarged, and there are adhesions between the posterior uterus and the rectum which appear chronic.  The left ovary is enlarged compared to the right but there is no obvious mass or abscess along the left ovary or tube.  Photo was taken of this for the patient's chart.  The patient was then returned to neutral position and omentum was brought down to cover the staple line and right lower quadrant.  The 53mm trocar site in the left lower quadrant was closed with a 0 vicryl in the fascia under direct visualization using a PMI device.  Additional local was infiltrated around this trocar site.  The abdomen was desufflated and all trocars removed. The skin incisions were closed with subcuticular monocryl and Dermabond. The patient was awakened, extubated and transported to the recovery room in stable condition.   All counts were correct at the completion of the case.

## 2020-09-17 ENCOUNTER — Encounter (HOSPITAL_COMMUNITY): Payer: Self-pay | Admitting: Surgery

## 2020-09-17 LAB — CBC
HCT: 26.8 % — ABNORMAL LOW (ref 36.0–46.0)
Hemoglobin: 8.9 g/dL — ABNORMAL LOW (ref 12.0–15.0)
MCH: 29.1 pg (ref 26.0–34.0)
MCHC: 33.2 g/dL (ref 30.0–36.0)
MCV: 87.6 fL (ref 80.0–100.0)
Platelets: 260 10*3/uL (ref 150–400)
RBC: 3.06 MIL/uL — ABNORMAL LOW (ref 3.87–5.11)
RDW: 13.9 % (ref 11.5–15.5)
WBC: 13.4 10*3/uL — ABNORMAL HIGH (ref 4.0–10.5)
nRBC: 0 % (ref 0.0–0.2)

## 2020-09-17 LAB — BASIC METABOLIC PANEL
Anion gap: 5 (ref 5–15)
BUN: <5 mg/dL — ABNORMAL LOW (ref 6–20)
CO2: 22 mmol/L (ref 22–32)
Calcium: 8.3 mg/dL — ABNORMAL LOW (ref 8.9–10.3)
Chloride: 108 mmol/L (ref 98–111)
Creatinine, Ser: 0.58 mg/dL (ref 0.44–1.00)
GFR, Estimated: >60 mL/min (ref >60)
Glucose, Bld: 137 mg/dL — ABNORMAL HIGH (ref 70–99)
Potassium: 3.5 mmol/L (ref 3.5–5.1)
Sodium: 135 mmol/L (ref 135–145)

## 2020-09-17 LAB — MAGNESIUM: Magnesium: 1.7 mg/dL (ref 1.7–2.4)

## 2020-09-17 MED ORDER — POTASSIUM CHLORIDE 20 MEQ PO PACK
40.0000 meq | PACK | Freq: Once | ORAL | Status: AC
  Administered 2020-09-17: 40 meq via ORAL
  Filled 2020-09-17: qty 2

## 2020-09-17 MED ORDER — MAGNESIUM SULFATE 2 GM/50ML IV SOLN
2.0000 g | Freq: Once | INTRAVENOUS | Status: AC
  Administered 2020-09-17: 2 g via INTRAVENOUS
  Filled 2020-09-17: qty 50

## 2020-09-17 NOTE — Plan of Care (Signed)

## 2020-09-17 NOTE — Progress Notes (Addendum)
1 Day Post-Op  Subjective: CC: Patient reports lower abdominal pain and constant nausea.  Only taking small amounts of clear liquids.  Denies flatus or BM.  She is mobilizing well and seen mobilizing in the room.  She is voiding without difficulty.  Objective: Vital signs in last 24 hours: Temp:  [97.3 F (36.3 C)-98.4 F (36.9 C)] 98.4 F (36.9 C) (03/20 0500) Pulse Rate:  [61-105] 61 (03/20 0500) Resp:  [14-19] 18 (03/20 0500) BP: (85-93)/(52-65) 90/62 (03/20 0500) SpO2:  [97 %-100 %] 100 % (03/20 0500)    Intake/Output from previous day: 03/19 0701 - 03/20 0700 In: 1800 [P.O.:700; I.V.:1000; IV Piggyback:100] Out: 65 [Urine:50; Blood:15] Intake/Output this shift: No intake/output data recorded.  PE: Gen:  Alert, NAD, pleasant HEENT: EOM's intact, pupils equal and round Card:  RRR, no M/G/R heard Pulm:  CTAB, no W/R/R, effort normal Abd: Soft, mild distension, tenderness around laparoscopic incisions and lower abdomen without peritonitis, hypoactive bs, Incisions with glue intact appears well and are without drainage, bleeding, or signs of infection Ext:  No LE edema Psych: A&Ox3 Skin: no rashes noted, warm and dry  Lab Results:  Recent Labs    09/15/20 2302 09/17/20 0106  WBC 11.8* 13.4*  HGB 12.5 8.9*  HCT 38.3 26.8*  PLT 398 260   BMET Recent Labs    09/15/20 2302 09/17/20 0106  NA 136 135  K 3.9 3.5  CL 105 108  CO2 22 22  GLUCOSE 141* 137*  BUN 14 <5*  CREATININE 0.60 0.58  CALCIUM 9.4 8.3*   PT/INR No results for input(s): LABPROT, INR in the last 72 hours. CMP     Component Value Date/Time   NA 135 09/17/2020 0106   K 3.5 09/17/2020 0106   CL 108 09/17/2020 0106   CO2 22 09/17/2020 0106   GLUCOSE 137 (H) 09/17/2020 0106   BUN <5 (L) 09/17/2020 0106   CREATININE 0.58 09/17/2020 0106   CALCIUM 8.3 (L) 09/17/2020 0106   PROT 6.9 09/15/2020 2302   ALBUMIN 4.1 09/15/2020 2302   AST 12 (L) 09/15/2020 2302   ALT 12 09/15/2020 2302    ALKPHOS 55 09/15/2020 2302   BILITOT 0.4 09/15/2020 2302   GFRNONAA >60 09/17/2020 0106   Lipase     Component Value Date/Time   LIPASE 12 09/15/2020 2302       Studies/Results: CT Abdomen Pelvis W Contrast  Result Date: 09/16/2020 CLINICAL DATA:  50 year old female with abdominal pain. Concern for bowel obstruction. EXAM: CT ABDOMEN AND PELVIS WITH CONTRAST TECHNIQUE: Multidetector CT imaging of the abdomen and pelvis was performed using the standard protocol following bolus administration of intravenous contrast. CONTRAST:  172mL OMNIPAQUE IOHEXOL 300 MG/ML  SOLN COMPARISON:  None. FINDINGS: Lower chest: Minimal bibasilar dependent atelectasis. Several small new nodules in the right middle lobe measure up to 3 mm, likely granuloma. The visualized lung bases are otherwise clear. No intra-abdominal free air. Small to moderate free fluid within the pelvis as well as in the left upper abdomen. Hepatobiliary: No focal liver abnormality is seen. No gallstones, gallbladder wall thickening, or biliary dilatation. Pancreas: Unremarkable. No pancreatic ductal dilatation or surrounding inflammatory changes. Spleen: Normal in size without focal abnormality. Adrenals/Urinary Tract: The adrenal glands unremarkable. The kidneys, visualized ureters, and urinary bladder appear unremarkable. Stomach/Bowel: There is no bowel obstruction. The appendix is suboptimally visualized. A tubular structure arising from the base of the cecum medially and extending inferiorly likely represents the appendix. There is hyperenhancement of  the appendiceal mucosa with thickened and dilatation of the appendix measuring up to 12 mm. There is inflammatory changes in the right lower quadrant and pericecal region. Findings most consistent with acute appendicitis. No drainable fluid collection or abscess. Vascular/Lymphatic: No significant vascular findings are present. No enlarged abdominal or pelvic lymph nodes. Reproductive: The uterus  is anteverted. Small calcified uterine fibroids. The ovaries are suboptimally visualized. Mildly dilated tubular structures in the region of the left adnexa may represent hydrosalpinx. Ultrasound may provide better evaluation of the pelvic structures. Other: None Musculoskeletal: No acute or significant osseous findings. IMPRESSION: 1. Findings most consistent with acute appendicitis. No drainable fluid collection or abscess. 2. Mildly dilated tubular structures in the region of the left adnexa may represent hydrosalpinx. Ultrasound may provide better evaluation of the pelvic structures. Electronically Signed   By: Anner Crete M.D.   On: 09/16/2020 01:22    Anti-infectives: Anti-infectives (From admission, onward)   Start     Dose/Rate Route Frequency Ordered Stop   09/16/20 1315  cefTRIAXone (ROCEPHIN) 2 g in sodium chloride 0.9 % 100 mL IVPB        2 g 200 mL/hr over 30 Minutes Intravenous Every 24 hours 09/16/20 1220     09/16/20 1315  metroNIDAZOLE (FLAGYL) IVPB 500 mg        500 mg 100 mL/hr over 60 Minutes Intravenous Every 8 hours 09/16/20 1220     09/16/20 1000  cefTRIAXone (ROCEPHIN) 2 g in sodium chloride 0.9 % 100 mL IVPB  Status:  Discontinued       "And" Linked Group Details   2 g 200 mL/hr over 30 Minutes Intravenous Every 24 hours 09/16/20 0203 09/16/20 1201   09/16/20 1000  metroNIDAZOLE (FLAGYL) IVPB 500 mg  Status:  Discontinued       "And" Linked Group Details   500 mg 100 mL/hr over 60 Minutes Intravenous Every 8 hours 09/16/20 0203 09/16/20 1201   09/16/20 0145  cefTRIAXone (ROCEPHIN) 1 g in sodium chloride 0.9 % 100 mL IVPB        1 g 200 mL/hr over 30 Minutes Intravenous  Once 09/16/20 0130 09/16/20 0216   09/16/20 0145  metroNIDAZOLE (FLAGYL) IVPB 500 mg        500 mg 100 mL/hr over 60 Minutes Intravenous  Once 09/16/20 0130 09/16/20 0324       Assessment/Plan ABL anemia - Hgb down from 12.5 -> 8.9, AM labs. BP 90/62 this am but no tachycardia. Monitor  closely. Hold prophylactic loveonx.   Acute appendicitis with diffuse pus throughout the abdomen/purulent peritonitis S/p Laparoscopic Appendectomy - Dr. Kae Heller - 09/16/2020 - POD #1 - Dr. Kae Heller noted adhesions noted between the posterior uterus/right ovary and rectum but no obvious tubo-ovarian abscess - Patient at high risk for ileus. Will keep on CLD today - Cont IV abx - Mobilize - Pulm toilet - AM labs  FEN - CLD, IVF (125cc/hr) VTE - SCDs ID - Rocephin/Flagyl    LOS: 0 days    Jillyn Ledger , [facility] Surgery 09/17/2020, 9:14 AM Please see Amion for pager number during day hours 7:00am-4:30pm

## 2020-09-18 LAB — BASIC METABOLIC PANEL
Anion gap: 5 (ref 5–15)
BUN: <5 mg/dL — ABNORMAL LOW (ref 6–20)
CO2: 21 mmol/L — ABNORMAL LOW (ref 22–32)
Calcium: 8 mg/dL — ABNORMAL LOW (ref 8.9–10.3)
Chloride: 110 mmol/L (ref 98–111)
Creatinine, Ser: 0.64 mg/dL (ref 0.44–1.00)
GFR, Estimated: >60 mL/min (ref >60)
Glucose, Bld: 94 mg/dL (ref 70–99)
Potassium: 3.5 mmol/L (ref 3.5–5.1)
Sodium: 136 mmol/L (ref 135–145)

## 2020-09-18 LAB — CBC
HCT: 26.9 % — ABNORMAL LOW (ref 36.0–46.0)
Hemoglobin: 8.8 g/dL — ABNORMAL LOW (ref 12.0–15.0)
MCH: 29 pg (ref 26.0–34.0)
MCHC: 32.7 g/dL (ref 30.0–36.0)
MCV: 88.8 fL (ref 80.0–100.0)
Platelets: 290 10*3/uL (ref 150–400)
RBC: 3.03 MIL/uL — ABNORMAL LOW (ref 3.87–5.11)
RDW: 14.2 % (ref 11.5–15.5)
WBC: 11.5 10*3/uL — ABNORMAL HIGH (ref 4.0–10.5)
nRBC: 0 % (ref 0.0–0.2)

## 2020-09-18 MED ORDER — ENOXAPARIN SODIUM 40 MG/0.4ML ~~LOC~~ SOLN
40.0000 mg | SUBCUTANEOUS | Status: DC
  Administered 2020-09-18 – 2020-09-20 (×3): 40 mg via SUBCUTANEOUS
  Filled 2020-09-18 (×3): qty 0.4

## 2020-09-18 NOTE — Plan of Care (Signed)

## 2020-09-18 NOTE — Progress Notes (Signed)
Patient states she tried to get up and walk around her room but she said she felt sick, she feels nauseated. Zofran was administered earlier this morning but patient states she had little relief with the medication.

## 2020-09-18 NOTE — Progress Notes (Signed)
2 Days Post-Op  Subjective: CC: Spanish interpreter used.  Patient reports that her pain is improved overall.  Still having mild lower abdominal pain.  Still having some nausea but this is overall improved.  Not drinking much other than a few sips of liquids with meds.  Began passing flatus and had a BM yesterday.  Only ambulating to the bathroom, has not ambulated in the halls.  Voiding.  Objective: Vital signs in last 24 hours: Temp:  [98 F (36.7 C)-98.2 F (36.8 C)] 98.2 F (36.8 C) (03/21 0451) Pulse Rate:  [78-84] 84 (03/21 0451) Resp:  [16-18] 18 (03/21 0451) BP: (104-116)/(72-81) 104/80 (03/21 0451) SpO2:  [100 %] 100 % (03/21 0451) Last BM Date: 09/17/20  Intake/Output from previous day: No intake/output data recorded. Intake/Output this shift: No intake/output data recorded.  PE: Gen:  Alert, NAD, pleasant HEENT: EOM's intact, pupils equal and round Card:  RRR, no M/G/R heard Pulm:  CTAB, no W/R/R, effort normal Abd: Soft, mild distension, tenderness around laparoscopic incisions and lower abdomen without peritonitis that appears appropriate, +bs, Incisions with glue intact appears well and are without drainage, bleeding, or signs of infection Ext:  No LE edema Psych: A&Ox3 Skin: no rashes noted, warm and dry  Lab Results:  Recent Labs    09/17/20 0106 09/18/20 0140  WBC 13.4* 11.5*  HGB 8.9* 8.8*  HCT 26.8* 26.9*  PLT 260 290   BMET Recent Labs    09/17/20 0106 09/18/20 0140  NA 135 136  K 3.5 3.5  CL 108 110  CO2 22 21*  GLUCOSE 137* 94  BUN <5* <5*  CREATININE 0.58 0.64  CALCIUM 8.3* 8.0*   PT/INR No results for input(s): LABPROT, INR in the last 72 hours. CMP     Component Value Date/Time   NA 136 09/18/2020 0140   K 3.5 09/18/2020 0140   CL 110 09/18/2020 0140   CO2 21 (L) 09/18/2020 0140   GLUCOSE 94 09/18/2020 0140   BUN <5 (L) 09/18/2020 0140   CREATININE 0.64 09/18/2020 0140   CALCIUM 8.0 (L) 09/18/2020 0140   PROT 6.9  09/15/2020 2302   ALBUMIN 4.1 09/15/2020 2302   AST 12 (L) 09/15/2020 2302   ALT 12 09/15/2020 2302   ALKPHOS 55 09/15/2020 2302   BILITOT 0.4 09/15/2020 2302   GFRNONAA >60 09/18/2020 0140   Lipase     Component Value Date/Time   LIPASE 12 09/15/2020 2302       Studies/Results: No results found.  Anti-infectives: Anti-infectives (From admission, onward)   Start     Dose/Rate Route Frequency Ordered Stop   09/16/20 1315  cefTRIAXone (ROCEPHIN) 2 g in sodium chloride 0.9 % 100 mL IVPB        2 g 200 mL/hr over 30 Minutes Intravenous Every 24 hours 09/16/20 1220     09/16/20 1315  metroNIDAZOLE (FLAGYL) IVPB 500 mg        500 mg 100 mL/hr over 60 Minutes Intravenous Every 8 hours 09/16/20 1220     09/16/20 1000  cefTRIAXone (ROCEPHIN) 2 g in sodium chloride 0.9 % 100 mL IVPB  Status:  Discontinued       "And" Linked Group Details   2 g 200 mL/hr over 30 Minutes Intravenous Every 24 hours 09/16/20 0203 09/16/20 1201   09/16/20 1000  metroNIDAZOLE (FLAGYL) IVPB 500 mg  Status:  Discontinued       "And" Linked Group Details   500 mg 100 mL/hr over 60  Minutes Intravenous Every 8 hours 09/16/20 0203 09/16/20 1201   09/16/20 0145  cefTRIAXone (ROCEPHIN) 1 g in sodium chloride 0.9 % 100 mL IVPB        1 g 200 mL/hr over 30 Minutes Intravenous  Once 09/16/20 0130 09/16/20 0216   09/16/20 0145  metroNIDAZOLE (FLAGYL) IVPB 500 mg        500 mg 100 mL/hr over 60 Minutes Intravenous  Once 09/16/20 0130 09/16/20 0324       Assessment/Plan ABL anemia - stabilized at 8.8   Acute appendicitis with diffuse pus throughout the abdomen/purulent peritonitis S/p Laparoscopic Appendectomy - Dr. Kae Heller - 09/16/2020 - POD #2 - Dr. Kae Heller noted adhesions noted between the posterior uterus/right ovary and rectum but no obvious tubo-ovarian abscess - CLD. Will check in pm to see if nausea improved and could adv to fld. She has had ROBF - Cont IV abx - Mobilize - Pulm toilet - AM labs. WBC  11.5  FEN - CLD, IVF (125cc/hr) VTE - SCDs, start Loveonx  ID - Rocephin/Flagyl  Follow-Up - DOW and GI (see Dr. Ron Parker note on 3/20)   LOS: 1 day    Jillyn Ledger , [facility] 09/18/2020, 9:53 AM Please see Amion for pager number during day hours 7:00am-4:30pm

## 2020-09-19 LAB — BASIC METABOLIC PANEL
Anion gap: 7 (ref 5–15)
BUN: <5 mg/dL — ABNORMAL LOW (ref 6–20)
CO2: 22 mmol/L (ref 22–32)
Calcium: 8.2 mg/dL — ABNORMAL LOW (ref 8.9–10.3)
Chloride: 108 mmol/L (ref 98–111)
Creatinine, Ser: 0.65 mg/dL (ref 0.44–1.00)
GFR, Estimated: >60 mL/min (ref >60)
Glucose, Bld: 76 mg/dL (ref 70–99)
Potassium: 3.3 mmol/L — ABNORMAL LOW (ref 3.5–5.1)
Sodium: 137 mmol/L (ref 135–145)

## 2020-09-19 LAB — CBC
HCT: 25.4 % — ABNORMAL LOW (ref 36.0–46.0)
Hemoglobin: 8.3 g/dL — ABNORMAL LOW (ref 12.0–15.0)
MCH: 28.8 pg (ref 26.0–34.0)
MCHC: 32.7 g/dL (ref 30.0–36.0)
MCV: 88.2 fL (ref 80.0–100.0)
Platelets: 300 10*3/uL (ref 150–400)
RBC: 2.88 MIL/uL — ABNORMAL LOW (ref 3.87–5.11)
RDW: 14.1 % (ref 11.5–15.5)
WBC: 7.9 10*3/uL (ref 4.0–10.5)
nRBC: 0 % (ref 0.0–0.2)

## 2020-09-19 MED ORDER — POTASSIUM CHLORIDE 20 MEQ PO PACK
40.0000 meq | PACK | Freq: Two times a day (BID) | ORAL | Status: AC
  Administered 2020-09-19 (×2): 40 meq via ORAL
  Filled 2020-09-19 (×2): qty 2

## 2020-09-19 MED ORDER — POLYETHYLENE GLYCOL 3350 17 G PO PACK: 17.0000 g | PACK | Freq: Every day | ORAL | Status: DC | PRN

## 2020-09-19 NOTE — Discharge Instructions (Signed)
[facility] LAPAROSCOPIC SURGERY: POST OP INSTRUCTIONS Always review your discharge instruction sheet given to you by the facility where your surgery was performed. IF YOU HAVE DISABILITY OR FAMILY LEAVE FORMS, YOU MUST BRING THEM TO THE OFFICE FOR PROCESSING.   DO NOT GIVE THEM TO YOUR DOCTOR.  PAIN CONTROL  1. First take acetaminophen (Tylenol) AND/or ibuprofen (Advil) to control your pain after surgery.  Follow directions on package.  Taking acetaminophen (Tylenol) and/or ibuprofen (Advil) regularly after surgery will help to control your pain and lower the amount of prescription pain medication you may need.  You should not take more than 3,000 mg (3 grams) of acetaminophen (Tylenol) in 24 hours.  You should not take ibuprofen (Advil), aleve, motrin, naprosyn or other NSAIDS if you have a history of stomach ulcers or chronic kidney disease.  2. A prescription for pain medication may be given to you upon discharge.  Take your pain medication as prescribed, if you still have uncontrolled pain after taking acetaminophen (Tylenol) or ibuprofen (Advil). 3. Use ice packs to help control pain. 4. If you need a refill on your pain medication, please contact your pharmacy.  They will contact our office to request authorization. Prescriptions will not be filled after 5pm or on week-ends.  HOME MEDICATIONS 5. Take your usually prescribed medications unless otherwise directed.  DIET 6. You should follow a light diet the first few days after arrival home.  Be sure to include lots of fluids daily. Avoid fatty, fried foods.   CONSTIPATION 7. It is common to experience some constipation after surgery and if you are taking pain medication.  Increasing fluid intake and taking a stool softener (such as Colace) will usually help or prevent this problem from occurring.  A mild laxative (Milk of Magnesia or Miralax) should be taken according to package instructions if there are no bowel  movements after 48 hours.  WOUND/INCISION CARE 8. Most patients will experience some swelling and bruising in the area of the incisions.  Ice packs will help.  Swelling and bruising can take several days to resolve.  9. Unless discharge instructions indicate otherwise, follow guidelines below  a. STERI-STRIPS - you may remove your outer bandages 48 hours after surgery, and you may shower at that time.  You have steri-strips (small skin tapes) in place directly over the incision.  These strips should be left on the skin for 7-10 days.   b. DERMABOND/SKIN GLUE - you may shower in 24 hours.  The glue will flake off over the next 2-3 weeks. 10. Any sutures or staples will be removed at the office during your follow-up visit.  ACTIVITIES 11. You may resume regular (light) daily activities beginning the next day--such as daily self-care, walking, climbing stairs--gradually increasing activities as tolerated.  You may have sexual intercourse when it is comfortable.  Refrain from any heavy lifting or straining until approved by your doctor. a. You may drive when you are no longer taking prescription pain medication, you can comfortably wear a seatbelt, and you can safely maneuver your car and apply brakes.  FOLLOW-UP 12. You should see your doctor in the office for a follow-up appointment approximately 2-3 weeks after your surgery.  You should have been given your post-op/follow-up appointment when your surgery was scheduled.  If you did not receive a post-op/follow-up appointment, make sure that you call for this appointment within a day or two after you arrive home to insure a convenient appointment time.     WHEN TO CALL YOUR DOCTOR: 1. Fever over 101.0 2. Inability to urinate 3. Continued bleeding from incision. 4. Increased pain, redness, or drainage from the incision. 5. Increasing abdominal pain  The clinic staff is available to answer your questions during regular business hours.  Please don't  hesitate to call and ask to speak to one of the nurses for clinical concerns.  If you have a medical emergency, go to the nearest emergency room or call 911.  A surgeon from [facility] is always on call at the hospital. [address], [address], [place], [place]  [address] ? [address] [address], , [place]   [address] [phone] ? [phone] ? FAX [phone] Web site: [email or web address]  .........   Managing Your Pain After Surgery Without Opioids    Thank you for participating in our program to help patients manage their pain after surgery without opioids. This is part of our effort to provide you with the best care possible, without exposing you or your family to the risk that opioids pose.  What pain can I expect after surgery? You can expect to have some pain after surgery. This is normal. The pain is typically worse the day after surgery, and quickly begins to get better. Many studies have found that many patients are able to manage their pain after surgery with Over-the-Counter (OTC) medications such as Tylenol and Motrin. If you have a condition that does not allow you to take Tylenol or Motrin, notify your surgical team.  How will I manage my pain? The best strategy for controlling your pain after surgery is around the clock pain control with Tylenol (acetaminophen) and Motrin (ibuprofen or Advil). Alternating these medications with each other allows you to maximize your pain control. In addition to Tylenol and Motrin, you can use heating pads or ice packs on your incisions to help reduce your pain.  How will I alternate your regular strength over-the-counter pain medication? You will take a dose of pain medication every three hours. ; Start by taking 650 mg of Tylenol (2 pills of 325 mg) ; 3 hours later take 600 mg of Motrin (3 pills of 200 mg) ; 3 hours after taking the Motrin take 650 mg of Tylenol ; 3 hours after that take 600 mg of  Motrin.   - 1 -  See example - if your first dose of Tylenol is at 12:00 PM   12:00 PM Tylenol 650 mg (2 pills of 325 mg)  3:00 PM Motrin 600 mg (3 pills of 200 mg)  6:00 PM Tylenol 650 mg (2 pills of 325 mg)  9:00 PM Motrin 600 mg (3 pills of 200 mg)  Continue alternating every 3 hours   We recommend that you follow this schedule around-the-clock for at least 3 days after surgery, or until you feel that it is no longer needed. Use the table on the last page of this handout to keep track of the medications you are taking. Important: Do not take more than 3000mg of Tylenol or 3200mg of Motrin in a 24-hour period. Do not take ibuprofen/Motrin if you have a history of bleeding stomach ulcers, severe kidney disease, &/or actively taking a blood thinner  What if I still have pain? If you have pain that is not controlled with the over-the-counter pain medications (Tylenol and Motrin or Advil) you might have what we call "breakthrough" pain. You will receive a prescription for a small amount of an opioid pain medication such as   Oxycodone, Tramadol, or Tylenol with Codeine. Use these opioid pills in the first 24 hours after surgery if you have breakthrough pain. Do not take more than 1 pill every 4-6 hours.  If you still have uncontrolled pain after using all opioid pills, don't hesitate to call our staff using the number provided. We will help make sure you are managing your pain in the best way possible, and if necessary, we can provide a prescription for additional pain medication.   Day 1    Time  Name of Medication Number of pills taken  Amount of Acetaminophen  Pain Level   Comments  AM PM       AM PM       AM PM       AM PM       AM PM       AM PM       AM PM       AM PM       Total Daily amount of Acetaminophen Do not take more than  3,000 mg per day      Day 2    Time  Name of Medication Number of pills taken  Amount of Acetaminophen  Pain Level   Comments  AM  PM       AM PM       AM PM       AM PM       AM PM       AM PM       AM PM       AM PM       Total Daily amount of Acetaminophen Do not take more than  3,000 mg per day      Day 3    Time  Name of Medication Number of pills taken  Amount of Acetaminophen  Pain Level   Comments  AM PM       AM PM       AM PM       AM PM          AM PM       AM PM       AM PM       AM PM       Total Daily amount of Acetaminophen Do not take more than  3,000 mg per day      Day 4    Time  Name of Medication Number of pills taken  Amount of Acetaminophen  Pain Level   Comments  AM PM       AM PM       AM PM       AM PM       AM PM       AM PM       AM PM       AM PM       Total Daily amount of Acetaminophen Do not take more than  3,000 mg per day      Day 5    Time  Name of Medication Number of pills taken  Amount of Acetaminophen  Pain Level   Comments  AM PM       AM PM       AM PM       AM PM       AM PM       AM PM       AM PM         AM PM       Total Daily amount of Acetaminophen Do not take more than  3,000 mg per day       Day 6    Time  Name of Medication Number of pills taken  Amount of Acetaminophen  Pain Level  Comments  AM PM       AM PM       AM PM       AM PM       AM PM       AM PM       AM PM       AM PM       Total Daily amount of Acetaminophen Do not take more than  3,000 mg per day      Day 7    Time  Name of Medication Number of pills taken  Amount of Acetaminophen  Pain Level   Comments  AM PM       AM PM       AM PM       AM PM       AM PM       AM PM       AM PM       AM PM       Total Daily amount of Acetaminophen Do not take more than  3,000 mg per day        For additional information about how and where to safely dispose of unused opioid medications - https://www.[employer]powerful[employer].org  Disclaimer: This document contains information and/or instructional materials adapted from [facility]  for the typical patient with your condition. It does not replace medical advice from your health care provider because your experience may differ from that of the typical patient. Talk to your health care provider if you have any questions about this document, your condition or your treatment plan. Adapted from [facility]  

## 2020-09-19 NOTE — Progress Notes (Signed)
Pt ambulating in room no needs at this time.

## 2020-09-19 NOTE — Progress Notes (Signed)
3 Days Post-Op  Subjective: CC: Spanish interpreter used.  Patient reports that her pain is improved overall.  Still having mild lower abdominal pain.  Still having some nausea this morning but it is not worsened with drinking cld/crakers and is improved from yesterday. Tolerating cld.  Passing flatus and had a BMs yesterday (one formed and one loose).  Ambulating in the halls yesterday.  Voiding.  Objective: Vital signs in last 24 hours: Temp:  [97.9 F (36.6 C)-99.5 F (37.5 C)] 97.9 F (36.6 C) (03/22 0526) Pulse Rate:  [72-88] 78 (03/22 0526) Resp:  [17-18] 17 (03/22 0526) BP: (118-120)/(81-85) 118/84 (03/22 0526) SpO2:  [98 %-99 %] 98 % (03/22 0526) Last BM Date: 09/18/20  Intake/Output from previous day: 03/21 0701 - 03/22 0700 In: 460 [P.O.:460] Out: -  Intake/Output this shift: No intake/output data recorded.  PE: Gen: Alert, NAD, pleasant HEENT: EOM's intact, pupils equal and round Card: RRR, no M/G/R heard Pulm: CTAB, no W/R/R, effort normal Abd: Soft, mild distension, tenderness around laparoscopic incisions and lower abdomen without peritonitis that appears appropriate, +bs,Incisions with glue intact appears well and are without drainage, bleeding, or signs of infection Ext: No LE edema Psych: A&Ox3 Skin: no rashes noted, warm and dry  Lab Results:  Recent Labs    09/18/20 0140 09/19/20 0314  WBC 11.5* 7.9  HGB 8.8* 8.3*  HCT 26.9* 25.4*  PLT 290 300   BMET Recent Labs    09/18/20 0140 09/19/20 0314  NA 136 137  K 3.5 3.3*  CL 110 108  CO2 21* 22  GLUCOSE 94 76  BUN <5* <5*  CREATININE 0.64 0.65  CALCIUM 8.0* 8.2*   PT/INR No results for input(s): LABPROT, INR in the last 72 hours. CMP     Component Value Date/Time   NA 137 09/19/2020 0314   K 3.3 (L) 09/19/2020 0314   CL 108 09/19/2020 0314   CO2 22 09/19/2020 0314   GLUCOSE 76 09/19/2020 0314   BUN <5 (L) 09/19/2020 0314   CREATININE 0.65 09/19/2020 0314   CALCIUM 8.2 (L)  09/19/2020 0314   PROT 6.9 09/15/2020 2302   ALBUMIN 4.1 09/15/2020 2302   AST 12 (L) 09/15/2020 2302   ALT 12 09/15/2020 2302   ALKPHOS 55 09/15/2020 2302   BILITOT 0.4 09/15/2020 2302   GFRNONAA >60 09/19/2020 0314   Lipase     Component Value Date/Time   LIPASE 12 09/15/2020 2302       Studies/Results: No results found.  Anti-infectives: Anti-infectives (From admission, onward)   Start     Dose/Rate Route Frequency Ordered Stop   09/16/20 1315  cefTRIAXone (ROCEPHIN) 2 g in sodium chloride 0.9 % 100 mL IVPB        2 g 200 mL/hr over 30 Minutes Intravenous Every 24 hours 09/16/20 1220     09/16/20 1315  metroNIDAZOLE (FLAGYL) IVPB 500 mg        500 mg 100 mL/hr over 60 Minutes Intravenous Every 8 hours 09/16/20 1220     09/16/20 1000  cefTRIAXone (ROCEPHIN) 2 g in sodium chloride 0.9 % 100 mL IVPB  Status:  Discontinued       "And" Linked Group Details   2 g 200 mL/hr over 30 Minutes Intravenous Every 24 hours 09/16/20 0203 09/16/20 1201   09/16/20 1000  metroNIDAZOLE (FLAGYL) IVPB 500 mg  Status:  Discontinued       "And" Linked Group Details   500 mg 100 mL/hr over 60  Minutes Intravenous Every 8 hours 09/16/20 0203 09/16/20 1201   09/16/20 0145  cefTRIAXone (ROCEPHIN) 1 g in sodium chloride 0.9 % 100 mL IVPB        1 g 200 mL/hr over 30 Minutes Intravenous  Once 09/16/20 0130 09/16/20 0216   09/16/20 0145  metroNIDAZOLE (FLAGYL) IVPB 500 mg        500 mg 100 mL/hr over 60 Minutes Intravenous  Once 09/16/20 0130 09/16/20 0324       Assessment/Plan ABL anemia - stable at 8.3  Acute appendicitis with diffuse pus throughout the abdomen/purulent peritonitis S/p Laparoscopic Appendectomy - Dr. Kae Heller - 09/16/2020 - POD #3 - Dr. Kae Heller noted adhesions noted between the posterior uterus/right ovary and rectum but no obvious tubo-ovarian abscess - Adv diet  - Cont IV abx - Mobilize - Pulm toilet  FEN - FLD. AAT to soft diet. Dec IVF VTE -SCDs, Loveonx  ID  -Rocephin/Flagyl3/19 >> WBC normalized to 8.3, afebrile Foley - None  Follow-Up - DOW and GI (see Dr. Ron Parker note on 3/20)    LOS: 2 days    Jillyn Ledger , [facility] 09/19/2020, 8:29 AM Please see Amion for pager number during day hours 7:00am-4:30pm

## 2020-09-20 LAB — URINALYSIS, COMPLETE (UACMP) WITH MICROSCOPIC: RBC / HPF: >50 RBC/hpf (ref 0–5)

## 2020-09-20 LAB — SURGICAL PATHOLOGY

## 2020-09-20 MED ORDER — POLYETHYLENE GLYCOL 3350 17 G PO PACK
17.0000 g | PACK | Freq: Every day | ORAL | Status: DC
  Filled 2020-09-20: qty 1

## 2020-09-20 MED ORDER — OXYCODONE HCL 5 MG PO TABS: 5.0000 mg | ORAL_TABLET | ORAL | Status: DC | PRN

## 2020-09-20 NOTE — Progress Notes (Signed)
4 Days Post-Op  Subjective: CC: Spanish interpreter used. Patient notes around 3pm yesterday she had cream of chicken, and a bowl of grits with increased nausea and lower abdominal pain. She reports her symptoms improved. She is having some nausea this morning and mild lower abdominal pain. She notes flatus and a formed bm yesterday. She did not have dinner yesterday and she has not had breakfast this morning. She is having some urinary hesitancy which is new for her. She is ambulating without difficulty.   Objective: Vital signs in last 24 hours: Temp:  [98.2 F (36.8 C)-98.3 F (36.8 C)] 98.2 F (36.8 C) (03/23 0515) Pulse Rate:  [70-86] 70 (03/23 0515) Resp:  [17-18] 18 (03/23 0515) BP: (116-126)/(80) 126/80 (03/23 0515) SpO2:  [97 %-98 %] 97 % (03/23 0515) Last BM Date: 09/18/20  Intake/Output from previous day: 03/22 0701 - 03/23 0700 In: 1677 [P.O.:577; I.V.:900; IV Piggyback:200] Out: -  Intake/Output this shift: No intake/output data recorded.  PE: Gen: Alert, NAD, pleasant HEENT: EOM's intact, pupils equal and round Card: RRR, no M/G/R heard Pulm: CTAB, no W/R/R, effort normal Abd: Soft, mild distension, tenderness around laparoscopic incisions and lower abdomen without peritonitis,+bs,Incisions with glue intact appears well and are without drainage, bleeding, or signs of infection Ext: No LE edema Psych: A&Ox3 Skin: no rashes noted, warm and dry  Lab Results:  Recent Labs    09/18/20 0140 09/19/20 0314  WBC 11.5* 7.9  HGB 8.8* 8.3*  HCT 26.9* 25.4*  PLT 290 300   BMET Recent Labs    09/18/20 0140 09/19/20 0314  NA 136 137  K 3.5 3.3*  CL 110 108  CO2 21* 22  GLUCOSE 94 76  BUN <5* <5*  CREATININE 0.64 0.65  CALCIUM 8.0* 8.2*   PT/INR No results for input(s): LABPROT, INR in the last 72 hours. CMP     Component Value Date/Time   NA 137 09/19/2020 0314   K 3.3 (L) 09/19/2020 0314   CL 108 09/19/2020 0314   CO2 22 09/19/2020 0314    GLUCOSE 76 09/19/2020 0314   BUN <5 (L) 09/19/2020 0314   CREATININE 0.65 09/19/2020 0314   CALCIUM 8.2 (L) 09/19/2020 0314   PROT 6.9 09/15/2020 2302   ALBUMIN 4.1 09/15/2020 2302   AST 12 (L) 09/15/2020 2302   ALT 12 09/15/2020 2302   ALKPHOS 55 09/15/2020 2302   BILITOT 0.4 09/15/2020 2302   GFRNONAA >60 09/19/2020 0314   Lipase     Component Value Date/Time   LIPASE 12 09/15/2020 2302       Studies/Results: No results found.  Anti-infectives: Anti-infectives (From admission, onward)   Start     Dose/Rate Route Frequency Ordered Stop   09/16/20 1315  cefTRIAXone (ROCEPHIN) 2 g in sodium chloride 0.9 % 100 mL IVPB        2 g 200 mL/hr over 30 Minutes Intravenous Every 24 hours 09/16/20 1220     09/16/20 1315  metroNIDAZOLE (FLAGYL) IVPB 500 mg        500 mg 100 mL/hr over 60 Minutes Intravenous Every 8 hours 09/16/20 1220     09/16/20 1000  cefTRIAXone (ROCEPHIN) 2 g in sodium chloride 0.9 % 100 mL IVPB  Status:  Discontinued       "And" Linked Group Details   2 g 200 mL/hr over 30 Minutes Intravenous Every 24 hours 09/16/20 0203 09/16/20 1201   09/16/20 1000  metroNIDAZOLE (FLAGYL) IVPB 500 mg  Status:  Discontinued       "  And" Linked Group Details   500 mg 100 mL/hr over 60 Minutes Intravenous Every 8 hours 09/16/20 0203 09/16/20 1201   09/16/20 0145  cefTRIAXone (ROCEPHIN) 1 g in sodium chloride 0.9 % 100 mL IVPB        1 g 200 mL/hr over 30 Minutes Intravenous  Once 09/16/20 0130 09/16/20 0216   09/16/20 0145  metroNIDAZOLE (FLAGYL) IVPB 500 mg        500 mg 100 mL/hr over 60 Minutes Intravenous  Once 09/16/20 0130 09/16/20 0324       Assessment/Plan ABL anemia -stable at 8.3  Acute appendicitis with diffuse pus throughout the abdomen/purulent peritonitis S/p Laparoscopic Appendectomy - Dr. Kae Heller - 09/16/2020 - POD #4 - Dr. Kae Heller noted adhesions noted between the posterior uterus/right ovary and rectum but no obvious tubo-ovarian abscess - Had some  nausea yesterday but is having bowel function. If tolerates FLD this morning will plan to adv to soft diet and recheck in the afternoon. If develops worsening nausea, will get KUB - Cont IV abx - Mobilize - Pulm toilet  FEN -FLD. AAT to soft diet. Dec IVF VTE -SCDs, Loveonx ID -Rocephin/Flagyl3/19 >> WBC normalized to 8.3, afebrile Foley - None  Follow-Up - DOW and GI (see Dr. Ron Parker note on 3/20)    LOS: 3 days    Jillyn Ledger , [facility] 09/20/2020, 8:33 AM Please see Amion for pager number during day hours 7:00am-4:30pm

## 2020-09-21 ENCOUNTER — Other Ambulatory Visit: Payer: Self-pay | Admitting: Physician Assistant

## 2020-09-21 MED ORDER — DOCUSATE SODIUM 100 MG PO CAPS: 100.0000 mg | ORAL_CAPSULE | Freq: Two times a day (BID) | ORAL | 0 refills | Status: DC | PRN

## 2020-09-21 MED ORDER — ACETAMINOPHEN 500 MG PO TABS: 1000.0000 mg | ORAL_TABLET | Freq: Four times a day (QID) | ORAL | 0 refills | Status: DC

## 2020-09-21 MED ORDER — METHOCARBAMOL 500 MG PO TABS: 500.0000 mg | ORAL_TABLET | Freq: Four times a day (QID) | ORAL | 0 refills | Status: DC | PRN

## 2020-09-21 MED ORDER — OXYCODONE HCL 5 MG PO TABS: 5.0000 mg | ORAL_TABLET | Freq: Four times a day (QID) | ORAL | 0 refills | Status: DC | PRN

## 2020-09-21 MED ORDER — ONDANSETRON 4 MG PO TBDP: 4.0000 mg | ORAL_TABLET | Freq: Four times a day (QID) | ORAL | 0 refills | Status: DC | PRN

## 2020-09-21 MED ORDER — POLYETHYLENE GLYCOL 3350 17 G PO PACK: 17.0000 g | PACK | Freq: Every day | ORAL | 0 refills | Status: DC | PRN

## 2020-09-21 MED FILL — ONDANSETRON ODT 4 MG TABLET: 4 | 4 days supply | Qty: 20 | Fill #0

## 2020-09-21 MED FILL — METHOCARBAMOL 500 MG TABS: 500 | 7 days supply | Qty: 30 | Fill #0

## 2020-09-21 MED FILL — oxyCODONE HCL 5 MG TABS: 5 | 4 days supply | Qty: 15 | Fill #0

## 2020-09-21 NOTE — Discharge Summary (Signed)
Patient ID: Tara Washington [id number] 03-17-1971 50 y.o.  Admit date: 09/15/2020 Discharge date: 09/21/2020  Admitting Diagnosis: Acute appendicitis  Discharge Diagnosis Patient Active Problem List   Diagnosis Date Noted  . Acute appendicitis 09/16/2020  . S/P laparoscopic appendectomy 09/16/2020   Consultants None   Reason for Admission: This is a 51 year old female who presented to [facility] with two days of abdominal pain, worse on the right side.  Some nausea/ no vomiting.  No urinary symptoms.  CT scan revealed findings consistent with acute appendicitis.  She is being transferred to [facility] for management.    Procedures Dr. Kae Heller - Laparoscopic Appendectomy - 09/16/2020  Hospital Course:  Patient was admitted to the general surgery service after she presented [facility] with 2 days of abdominal pain and was found to have acute appendicitis on CT.  Patient was taken to the operating room on 3/19 by Dr. Kae Heller and underwent laparoscopic appendectomy.  Intra-Op patient was found to have Acute appendicitis with diffuse pus throughout the abdomen/purulent peritonitis, adhesions noted between the posterior uterus/right ovary and rectum but no obvious tubo-ovarian abscess.  Patient remained on antibiotics postop. Patient developed an ileus postoperatively.  This resolved and diet was advanced and tolerated. On POD #5, the patient was voiding well, tolerating diet, ambulating well, pain well controlled, vital signs stable, incisions c/d/i and felt stable for discharge home. Follow up as noted below.  We discussed post-op instructions, lifting precautions at home and not to drive/operate heavy machine while taking narcotic pain medications.  She declined a note for work.  Return precautions were advised.  To note - patient did mention a history of intermittent attacks of lower abdominal pain, nausea, vomiting, fever over the last 2-3 years.  She was recommended  follow-up with GI as an outpatient to determine if upper/lower endoscopies would be helpful for further evaluation.  Please see Dr. Ron Parker note on 3/20 for more details.  Physical Exam: Gen: Alert, NAD, pleasant HEENT: EOM's intact, pupils equal and round Card: RRR, no M/G/R heard Pulm: CTAB, no W/R/R, effort normal Abd: Soft, ND, tenderness around laparoscopic incisions and lower abdomen without peritonitis,+bs,Incisions with glue intact appears well and are without drainage, bleeding, or signs of infection Ext: No LE edema Psych: A&Ox3 Skin: no rashes noted, warm and dry  Allergies as of 09/21/2020      Reactions   Cortisone    Other reaction(s): itching   Lactose Intolerance (gi) Other (See Comments)   Upset stomach   Penicillins Other (See Comments)   unknown      Medication List    TAKE these medications   acetaminophen 500 MG tablet Commonly known as: TYLENOL Take 2 tablets (1,000 mg total) by mouth every 6 (six) hours.   docusate sodium 100 MG capsule Commonly known as: COLACE Take 1 capsule (100 mg total) by mouth 2 (two) times daily as needed for mild constipation.   methocarbamol 500 MG tablet Commonly known as: ROBAXIN Take 1 tablet (500 mg total) by mouth every 6 (six) hours as needed for muscle spasms.   MULTIVITAMIN ADULT (MINERALS) PO Take 1 tablet by mouth daily.   naproxen sodium 220 MG tablet Commonly known as: ALEVE Take 220 mg by mouth daily as needed (pain).   ondansetron 4 MG disintegrating tablet Commonly known as: ZOFRAN-ODT Take 1 tablet (4 mg total) by mouth every 6 (six) hours as needed for nausea.   oxyCODONE 5 MG immediate release tablet Commonly known as: Oxy IR/ROXICODONE  Take 1 tablet (5 mg total) by mouth every 6 (six) hours as needed for breakthrough pain.   polyethylene glycol 17 g packet Commonly known as: MIRALAX / GLYCOLAX Take 17 g by mouth daily as needed.   psyllium 58.6 % packet Commonly known as:  METAMUCIL Take 1 packet by mouth daily.   TURMERIC CURCUMIN PO Take 1 tablet by mouth daily.   VITAMIN B 12 PO Take 1 tablet by mouth daily.         Follow-up Information    Surgery, [place]. Go on 10/10/2020.   Specialty: General Surgery Why: 915am. Please arrive 30 min prior to appointment time. Bring photo ID and insurance information.  Contact information: [place] [address] [phone]        Gastroenterology, [facility] Follow up.   Contact information: [place] [address] [phone]               Signed: Alferd Apa, [facility] 09/21/2020, 9:09 AM Please see Amion for pager number during day hours 7:00am-4:30pm

## 2020-09-21 NOTE — Progress Notes (Signed)
Notified by [facility] that meds need to go to [place] as patient is eligible for a MATCH. I have called [facility] and cancelled prescriptions and resent meds to [place].

## 2020-10-04 ENCOUNTER — Ambulatory Visit: Payer: Self-pay | Admitting: Physician Assistant

## 2020-10-04 ENCOUNTER — Other Ambulatory Visit: Payer: Self-pay

## 2020-10-04 VITALS — BP 119/74 | HR 99 | Temp 98.7°F | Ht 62.0 in | Wt 116.0 lb

## 2020-10-04 DIAGNOSIS — D649 Anemia, unspecified: Secondary | ICD-10-CM

## 2020-10-04 DIAGNOSIS — R1084 Generalized abdominal pain: Secondary | ICD-10-CM

## 2020-10-04 DIAGNOSIS — Z9049 Acquired absence of other specified parts of digestive tract: Secondary | ICD-10-CM

## 2020-10-04 DIAGNOSIS — E876 Hypokalemia: Secondary | ICD-10-CM

## 2020-10-04 NOTE — Progress Notes (Signed)
Patient verified DOB Patient complains of stomach pain and constipation since surgery in September. 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 Patient saw PCP in January and felt brushed off. Patient presented to ED 3/18 and was prepped for appendectomy. Patient has not taken the oxycodone. Patient has anxiety due to emergency surgery being completed after she feels like her PCP ignored her.

## 2020-10-04 NOTE — Progress Notes (Signed)
New Patient Office Visit  Subjective:  Patient ID: Tara Washington, female    DOB: 1970-12-18  Age: 50 y.o. MRN: [id number]  CC:  Chief Complaint  Patient presents with  . Abdominal Pain    HPI [name] reports that she was recently hospitalized from March 18 through March 24 for acute appendicitis.  Hospital Course:  Patient was admitted to the general surgery service after she presented [facility] with 2 days of abdominal pain and was found to have acute appendicitis on CT.  Patient was taken to the operating room on 3/19 by Dr. Kae Heller and underwent laparoscopic appendectomy.  Intra-Op patient was found to have Acute appendicitis with diffuse pus throughout the abdomen/purulent peritonitis, adhesions noted between the posterior uterus/right ovary and rectum but no obvious tubo-ovarian abscess.  Patient remained on antibiotics postop. Patient developed an ileus postoperatively.  This resolved and diet was advanced and tolerated. On POD #5, the patient was voiding well, tolerating diet, ambulating well, pain well controlled, vital signs stable, incisions c/d/i and felt stable for discharge home. Follow up as noted below.  We discussed post-op instructions, lifting precautions at home and not to drive/operate heavy machine while taking narcotic pain medications.  She declined a note for work.  Return precautions were advised.  To note - patient did mention a history of intermittent attacks of lower abdominal pain, nausea, vomiting, fever over the last 2-3 years.  She was recommended follow-up with GI as an outpatient to determine if upper/lower endoscopies would be helpful for further evaluation.  Please see Dr. Ron Parker note on 3/20 for more details.  Note from GI consult  Likely reactive ileus secondary to diffuse purulent peritonitis (pre-op).  I discussed the intraoperative findings with her and her husband, including adhesions noted between the  reproductive organs and the rectum in the pelvis.  Discussed that hopefully her ileus will resolve in the next couple of days but if she has persistent symptoms especially if her white count persists or she develops fever she will need a repeat CT scan in a few days.    They provided an interesting additional history of intermittent attacks of severe lower abdominal pain, nausea, vomiting and fever.  The first time this happened was 2 or 3 years ago.  She has had this most recently in January when she went to [facility] urgent care, labs were essentially normal and she had to leave before a CT scan was completed.  She endorses a history chronically of irritable colon, and did have a bout of severe constipation prior to developing appendicitis which was associated with some bright red blood per rectum.  We will need to help her establish care with a gastroenterologist outpatient for upper and lower endoscopy when she has recovered from this event.  I told her I am not sure what the etiology of the pelvic adhesions was or how contributory they may be to her history.   CT abdomen Musculoskeletal: No acute or significant osseous findings.  IMPRESSION: 1. Findings most consistent with acute appendicitis. No drainable fluid collection or abscess. 2. Mildly dilated tubular structures in the region of the left adnexa may represent hydrosalpinx. Ultrasound may provide better evaluation of the pelvic structures.   Electronically Signed   By: Anner Crete M.D.   On: 09/16/2020 01:22   Reports today that she has been having severe pain, states that sometimes the pain makes her feel nauseous.  Reports that she did not start the oxycodone that was  prescribed, states that she does not want to take this because she has a small child at home.  Reports that she has been taking Tylenol 500 mg and occasionally using the muscle relaxer without relief.  Reports that she is very scared that she still has infection in  her abdomen.  Reports that she is scared that she will have to have another emergency surgery.  Reports that she has an appointment to follow-up with the surgeon on October 10, 2020 and has an appointment with gastroenterology on October 18, 2020.  Reports that she did have a bowel movement yesterday, has been using Metamucil.    Past Medical History:  Diagnosis Date  . COVID-19    history of 06/20/2021    Past Surgical History:  Procedure Laterality Date  . HERNIA REPAIR    . LAPAROSCOPIC APPENDECTOMY N/A 09/16/2020   Procedure: APPENDECTOMY LAPAROSCOPIC;  Surgeon: Clovis Riley, MD;  Location: [facility];  Service: General;  Laterality: N/A;  . TUBAL LIGATION      History reviewed. No pertinent family history.  Social History   Socioeconomic History  . Marital status: Married    Spouse name: Not on file  . Number of children: Not on file  . Years of education: Not on file  . Highest education level: Not on file  Occupational History  . Not on file  Tobacco Use  . Smoking status: Never Smoker  . Smokeless tobacco: Never Used  Substance and Sexual Activity  . Alcohol use: Not Currently  . Drug use: Never  . Sexual activity: Yes  Other Topics Concern  . Not on file  Social History Narrative  . Not on file   Social Determinants of Health   Financial Resource Strain: Not on file  Food Insecurity: Not on file  Transportation Needs: Not on file  Physical Activity: Not on file  Stress: Not on file  Social Connections: Not on file  Intimate Partner Violence: Not on file    ROS Review of Systems  Constitutional: Negative for chills and fever.  HENT: Negative.   Eyes: Negative.   Respiratory: Negative for shortness of breath.   Cardiovascular: Negative for chest pain.  Gastrointestinal: Positive for abdominal pain, constipation and nausea. Negative for diarrhea.  Endocrine: Negative.   Genitourinary: Negative.   Musculoskeletal: Negative.   Skin: Negative.    Allergic/Immunologic: Negative.   Neurological: Negative.   Hematological: Negative.   Psychiatric/Behavioral: Negative.     Objective:   Today's Vitals: BP 119/74 (BP Location: Left Arm, Patient Position: Sitting, Cuff Size: Normal)   Pulse 99   Temp 98.7 F (37.1 C) (Oral)   Ht 5\' 2"  (1.575 m)   Wt 116 lb (52.6 kg)   HC 18" (45.7 cm)   BMI 21.22 kg/m   Physical Exam Vitals and nursing note reviewed.  Constitutional:      Appearance: She is well-developed.  HENT:     Head: Normocephalic and atraumatic.     Right Ear: External ear normal.     Left Ear: External ear normal.     Nose: Nose normal.     Mouth/Throat:     Mouth: Mucous membranes are moist.     Pharynx: Oropharynx is clear.  Eyes:     Conjunctiva/sclera: Conjunctivae normal.     Pupils: Pupils are equal, round, and reactive to light.  Cardiovascular:     Rate and Rhythm: Normal rate.  Pulmonary:     Effort: Pulmonary effort is normal.  Breath sounds: Normal breath sounds.  Abdominal:     Tenderness: There is generalized abdominal tenderness. There is guarding.  Musculoskeletal:        General: Normal range of motion.     Cervical back: Neck supple.  Skin:    General: Skin is warm and dry.  Neurological:     General: No focal deficit present.     Mental Status: She is alert and oriented to person, place, and time.     Assessment & Plan:   Problem List Items Addressed This Visit      Other   S/P laparoscopic appendectomy - Primary    Other Visit Diagnoses    Anemia, unspecified type       Relevant Orders   CBC with Differential/Platelet (Completed)   Iron, TIBC and Ferritin Panel (Completed)   Hypokalemia       Relevant Orders   Comp. Metabolic Panel (12) (Completed)   Generalized abdominal pain          Outpatient Encounter Medications as of 10/04/2020  Medication Sig  . acetaminophen (TYLENOL) 500 MG tablet Take 2 tablets (1,000 mg total) by mouth every 6 (six) hours.  .  Cyanocobalamin (VITAMIN B 12 PO) Take 1 tablet by mouth daily.  Marland Kitchen docusate sodium (COLACE) 100 MG capsule Take 1 capsule (100 mg total) by mouth 2 (two) times daily as needed for mild constipation.  . methocarbamol (ROBAXIN) 500 MG tablet Take 1 tablet (500 mg total) by mouth every 6 (six) hours as needed for muscle spasms.  . Multiple Vitamins-Minerals (MULTIVITAMIN ADULT, MINERALS, PO) Take 1 tablet by mouth daily.  . naproxen sodium (ALEVE) 220 MG tablet Take 220 mg by mouth daily as needed (pain).  . ondansetron (ZOFRAN-ODT) 4 MG disintegrating tablet Take 1 tablet (4 mg total) by mouth every 6 (six) hours as needed for nausea.  . polyethylene glycol (MIRALAX / GLYCOLAX) 17 g packet Take 17 g by mouth daily as needed.  . psyllium (METAMUCIL) 58.6 % packet Take 1 packet by mouth daily.  . TURMERIC CURCUMIN PO Take 1 tablet by mouth daily.  Marland Kitchen oxyCODONE (OXY IR/ROXICODONE) 5 MG immediate release tablet Take 1 tablet (5 mg total) by mouth every 6 (six) hours as needed for breakthrough pain. (Patient not taking: Reported on 10/04/2020)  . oxyCODONE (OXY IR/ROXICODONE) 5 MG immediate release tablet TAKE 1 TABLET (5 MG TOTAL) BY MOUTH EVERY SIX HOURS AS NEEDED FOR BREAKTHROUGH PAIN. (Patient not taking: Reported on 10/04/2020)  . [DISCONTINUED] methocarbamol (ROBAXIN) 500 MG tablet TAKE 1 TABLET (500 MG TOTAL) BY MOUTH EVERY SIX HOURS AS NEEDED FOR MUSCLE SPASMS.  . [DISCONTINUED] ondansetron (ZOFRAN-ODT) 4 MG disintegrating tablet TAKE 1 TABLET (4 MG TOTAL) BY MOUTH EVERY SIX HOURS AS NEEDED FOR NAUSEA.   No facility-administered encounter medications on file as of 10/04/2020.  1. S/P laparoscopic appendectomy Reassurance given, patient again stated she does not want to use the oxycodone and refused prescription for tramadol to help her with her pain.  Encouraged patient to increase dosing of Tylenol and trial of ibuprofen if Tylenol not effective.  Red flags given for prompt reevaluation.  2. Anemia,  unspecified type Last CBC did show decreased hemoglobin, this was more than likely from dilution, however will recheck - CBC with Differential/Platelet - Iron, TIBC and Ferritin Panel  3. Hypokalemia  - Comp. Metabolic Panel (12)  4. Generalized abdominal pain   I have reviewed the patient's medical history (PMH, PSH, Social History, Family History, Medications,  and allergies) , and have been updated if relevant. I spent 30 minutes reviewing chart and  face to face time with patient.      Follow-up: Return if symptoms worsen or fail to improve, for with Durene Fruits, NP at [place] at [facility].   Loraine Grip Mayers, PA-C

## 2020-10-04 NOTE — Patient Instructions (Signed)
You can take Tylenol 1000 mg OR ibuprofen 800 mg, you can take these up to 3 times a day.  You can use Metamucil and/or MiraLAX to help with your bowel movements.  MiraLAX is available over-the-counter.  We will call you with your lab results.  I strongly encourage you to call gastroenterology as well as a surgeon to see if you are able to be seen sooner.  I hope that you feel better soon  Kennieth Rad, PA-C Physician Assistant [place] [email or web address]     Acetaminophen tablets or caplets Qu es este medicamento? El ACETAMINOFENO es un analgsico. Se utiliza para tratar los dolores leves y para Engineer, materials fiebre. Este medicamento puede ser utilizado para otros usos; si tiene alguna pregunta consulte con su proveedor de atencin mdica o con su farmacutico. MARCAS COMUNES: Aceta, Actamin, Anacin Aspirin Free, Genapap, Genebs, Mapap, Pain & Fever, Pain and Fever, PAIN RELIEF, PAIN RELIEF Extra Strength, Pain Reliever, Panadol, PHARBETOL, Plus PHARMA, Q-Pap, Q-Pap Extra Strength, Tylenol, Tylenol CrushableTablet, Tylenol Extra Strength, Tylenol Regular Strength, XS No Aspirin, XS Pain Reliever Qu le debo informar a mi profesional de la salud antes de tomar este medicamento? Necesita saber si usted presenta alguno de los siguientes problemas o situaciones:  si consume alcohol con frecuencia  enfermedad heptica  una reaccin alrgica o inusual al acetaminofeno, a otros medicamentos, alimentos, colorantes o conservantes  si est embarazada o buscando quedar embarazada  si est amamantando a un beb Cmo debo utilizar este medicamento? Tome este medicamento por va oral con un vaso de agua. Siga las instrucciones de la etiqueta o del envase del medicamento. Tome sus dosis a intervalos regulares. No tome su medicamento con una frecuencia mayor a la indicada. Hable con su pediatra para informarse acerca del uso de este  medicamento en nios. Aunque este medicamento ha sido recetado a nios tan menores como de 6 aos de edad para condiciones selectivas, las precauciones se aplican. Sobredosis: Pngase en contacto inmediatamente con un centro toxicolgico o una sala de urgencia si usted cree que haya tomado demasiado medicamento. ATENCIN: [employer] es solo para usted. No comparta este medicamento con nadie. Qu sucede si me olvido de una dosis? Si olvida una dosis, tmela lo antes posible. Si es casi la hora de la prxima dosis, tome slo esa dosis. No tome dosis adicionales o dobles. Qu puede interactuar con este medicamento?  alcohol  imatinib  isoniazida  otros medicamentos con acetaminofeno Puede ser que esta lista no menciona todas las posibles interacciones. Informe a su profesional de [employer]	[place] de [employer] productos a base de hierbas, medicamentos de [place] o suplementos nutritivos que est tomando. Si usted fuma, consume bebidas alcohlicas o si utiliza drogas ilegales, indqueselo tambin a su profesional de [employer]	[place]. Algunas sustancias pueden interactuar con su medicamento. A qu debo estar atento al usar [employer]? Informe a su mdico o profesional de la salud si el dolor dura ms de 10 das (5 das en nios), si [place], o si tiene un dolor nuevo o diferente. Tambin consulte con su mdico si la fiebre dura ms de 3 das. No tome con este medicamento otros medicamentos que contengan acetaminofeno. Siempre lea atentamente las etiquetas. Si tiene [place] duda, pregntele a su mdico o farmacutico. Si toma demasiado acetaminofeno, busque ayuda mdica de inmediato. Demasiado acetaminofeno puede ser muy peligroso y causar dao heptico. Incluso si no tiene sntomas, es importante obtener ayuda de inmediato. Qu efectos secundarios puedo tener al  utilizar este medicamento? Efectos secundarios que debe informar a su mdico o a Barrister's clerk de la salud tan pronto como sea  posible:  Chief of Staff como erupcin cutnea, picazn o urticarias, hinchazn de la cara, labios o lengua  problemas respiratorios  fiebre o dolor de garganta  enrojecimiento, formacin de ampollas, descamacin o distensin de la piel, inclusive dentro de la boca  dificultad para orinar o cambios en el volumen de orina  sangrado o magulladuras inusuales  cansancio o debilidad inusual  color amarillento de ojos o piel Efectos secundarios que, por lo general, no requieren atencin mdica (debe informarlos a su mdico o a su profesional de la salud si persisten o si son molestos):  dolor de cabeza  nuseas, Higher education careers adviser Puede ser que esta lista no menciona todos los posibles efectos secundarios. Comunquese a su mdico por asesoramiento mdico [employer] Usted puede informar los efectos secundarios a la FDA por telfono al 1-800-FDA-1088. Dnde debo guardar mi medicina? Mantngala fuera del alcance de los nios. Gurdela a [employer], entre 20 y 35 grados C (12 y 97 grados F). Protjala de la humedad y del Freight forwarder. Deseche todo el medicamento que no haya utilizado, despus de la fecha de vencimiento. ATENCIN: Este folleto es un resumen. Puede ser que no cubra toda la posible informacin. Si usted tiene preguntas acerca de esta medicina, consulte con su mdico, su farmacutico o su profesional de Technical sales engineer.  2021 Elsevier/Gold Standard (2016-07-18 00:00:00)

## 2020-10-05 LAB — COMP. METABOLIC PANEL (12)
AST: 21 IU/L (ref 0–40)
Albumin/Globulin Ratio: 1.3 (ref 1.2–2.2)
Albumin: 4.4 g/dL (ref 3.8–4.8)
Alkaline Phosphatase: 78 IU/L (ref 44–121)
BUN/Creatinine Ratio: 18 (ref 9–23)
BUN: 11 mg/dL (ref 6–24)
Bilirubin Total: <0.2 mg/dL (ref 0.0–1.2)
Calcium: 10.3 mg/dL — ABNORMAL HIGH (ref 8.7–10.2)
Chloride: 101 mmol/L (ref 96–106)
Creatinine, Ser: 0.6 mg/dL (ref 0.57–1.00)
Globulin, Total: 3.3 g/dL (ref 1.5–4.5)
Glucose: 93 mg/dL (ref 65–99)
Potassium: 4.6 mmol/L (ref 3.5–5.2)
Sodium: 137 mmol/L (ref 134–144)
Total Protein: 7.7 g/dL (ref 6.0–8.5)
eGFR: 109 mL/min/{1.73_m2} (ref >59)

## 2020-10-05 LAB — IRON,TIBC AND FERRITIN PANEL
Ferritin: 18 ng/mL (ref 15–150)
Iron Saturation: 8 % — CL (ref 15–55)
Iron: 30 ug/dL (ref 27–159)
Total Iron Binding Capacity: 365 ug/dL (ref 250–450)
UIBC: 335 ug/dL (ref 131–425)

## 2020-10-05 LAB — CBC WITH DIFFERENTIAL/PLATELET
Basophils Absolute: 0.1 10*3/uL (ref 0.0–0.2)
Basos: 1 %
EOS (ABSOLUTE): 0.1 10*3/uL (ref 0.0–0.4)
Eos: 3 %
Hematocrit: 35.8 % (ref 34.0–46.6)
Hemoglobin: 11.6 g/dL (ref 11.1–15.9)
Immature Grans (Abs): 0 10*3/uL (ref 0.0–0.1)
Immature Granulocytes: 0 %
Lymphocytes Absolute: 1.6 10*3/uL (ref 0.7–3.1)
Lymphs: 42 %
MCH: 28.4 pg (ref 26.6–33.0)
MCHC: 32.4 g/dL (ref 31.5–35.7)
MCV: 88 fL (ref 79–97)
Monocytes Absolute: 0.4 10*3/uL (ref 0.1–0.9)
Monocytes: 9 %
Neutrophils Absolute: 1.7 10*3/uL (ref 1.4–7.0)
Neutrophils: 45 %
Platelets: 546 10*3/uL — ABNORMAL HIGH (ref 150–450)
RBC: 4.09 x10E6/uL (ref 3.77–5.28)
RDW: 13.4 % (ref 11.7–15.4)
WBC: 3.8 10*3/uL (ref 3.4–10.8)

## 2020-10-08 DIAGNOSIS — E876 Hypokalemia: Secondary | ICD-10-CM | POA: Insufficient documentation

## 2020-10-08 DIAGNOSIS — D649 Anemia, unspecified: Secondary | ICD-10-CM | POA: Insufficient documentation

## 2020-10-17 ENCOUNTER — Telehealth: Payer: Self-pay | Admitting: *Deleted

## 2020-10-17 NOTE — Telephone Encounter (Signed)
Medical Assistant used [employer] to contact patient.  Interpreter Name: Lorrin Jackson #: [id number] Patient was not available, [employer] left patient a voicemail. Voicemail states to give a call back to [place] with [facility] at [phone].

## 2020-10-17 NOTE — Telephone Encounter (Signed)
-----   Message from Kennieth Rad, [place] sent at 10/05/2020  9:00 AM EDT ----- Please call patient and let her know that her hemoglobin is back within normal limits, her white blood cell count is within normal limits, her kidney and liver function are within normal limits.  Her platelets are slightly elevated and her iron saturation is low, this is more than likely just due to her recent surgery.

## 2020-10-18 ENCOUNTER — Ambulatory Visit: Payer: Self-pay | Admitting: Internal Medicine

## 2020-10-18 ENCOUNTER — Encounter: Payer: Self-pay | Admitting: Internal Medicine

## 2020-10-18 VITALS — BP 98/60 | HR 77 | Ht 62.0 in | Wt 113.0 lb

## 2020-10-18 DIAGNOSIS — K5900 Constipation, unspecified: Secondary | ICD-10-CM

## 2020-10-18 DIAGNOSIS — E611 Iron deficiency: Secondary | ICD-10-CM

## 2020-10-18 DIAGNOSIS — K625 Hemorrhage of anus and rectum: Secondary | ICD-10-CM

## 2020-10-18 DIAGNOSIS — R109 Unspecified abdominal pain: Secondary | ICD-10-CM

## 2020-10-18 MED ORDER — DICYCLOMINE HCL 20 MG PO TABS: 20.0000 mg | ORAL_TABLET | Freq: Four times a day (QID) | ORAL | 1 refills | Status: DC | PRN

## 2020-10-18 MED ORDER — PLENVU 140 G PO SOLR: 1.0000 | ORAL | 0 refills | Status: DC

## 2020-10-18 NOTE — Progress Notes (Signed)
Tara Washington 50 y.o. 1971-01-31 [id number]  Assessment & Plan:   Encounter Diagnoses  Name Primary?  . Constipation, unspecified constipation type Yes  . Iron deficiency   . Rectal bleeding   . Abdominal wall pain     Sounds like an irritable bowel syndrome phenomenon.  There was no sign of inflammatory bowel disease on the appendectomy specimen.  She has some iron deficiency more than likely menstrual but a little bit of rectal bleeding etc. and the constellation of these things indicates she should have a colonoscopy.  She clearly has abdominal wall pain but it sounds like there are other intra-abdominal gastrointestinal issues as well.  Meds ordered this encounter  Medications  . dicyclomine (BENTYL) 20 MG tablet    Sig: Take 1 tablet (20 mg total) by mouth every 6 (six) hours as needed for spasms (abdominal pain).    Dispense:  60 tablet    Refill:  1  . PEG-KCl-NaCl-NaSulf-Na Asc-C (PLENVU) 140 g SOLR    Sig: Take 1 kit by mouth as directed.    Dispense:  1 each    Refill:  0     Evaluate with colonoscopy.  The risks and benefits as well as alternatives of endoscopic procedure(s) have been discussed and reviewed. All questions answered. The patient agrees to proceed.   Subjective:   Chief Complaint: Constipation and abdominal pain  HPI The patient is a 50 year old [place] woman here with complaints of chronic constipation and abdominal pain issues.  She was recently hospitalized for ruptured appendicitis and had a laparoscopic appendectomy by Dr. Kae Heller on 09/16/2020.  Pathology consistent with acute appendicitis.  Prior to that she was having some issues with constipation.  So its not new she has a lot of incomplete defecation things are somewhat worse and she is having some right lower quadrant pain and she is worried she is going to have severe pain and have to go back to the hospital.  She feels bloated frequently.  She is using MiraLAX and  Metamucil with success though still does not feel completely well regarding defecation.  It sounds like there may be some prolapsing hemorrhoids and when she strains at times she can see some slight bright red blood.  She has not had a history of colonoscopy.  Her CT scan that diagnosed her acute appendicitis showed that and also a mildly dilated tubular structure in the left adnexal region thought to probably represent hydrosalpinx.  She also had an anteverted uterus with calcified fibroids.  Her pains are not postprandial not related to movement etc. they can be somewhat random.  Husband translates when she cannot answer in [place].  Sleep is not really affected.  She has iron saturation consistent with iron deficiency but is not anemic though she was when she was hospitalized.  Lab Results  Component Value Date   IRON 30 10/04/2020   TIBC 365 10/04/2020   FERRITIN 18 10/04/2020   CBC Latest Ref Rng & Units 10/04/2020 09/19/2020 09/18/2020  WBC 3.4 - 10.8 x10E3/uL 3.8 7.9 11.5(H)  Hemoglobin 11.1 - 15.9 g/dL 11.6 8.3(L) 8.8(L)  Hematocrit 34.0 - 46.6 % 35.8 25.4(L) 26.9(L)  Platelets 150 - 450 x10E3/uL 546(H) 300 290    She does have regular menses she had increased number of clots in her 2 cycles prior to her appendectomy but then had a more normal cycle.  She does not have a prior history of anemia that she knows of.  She did have mesh hernia  repair and lysis of adhesions at [facility] in 2015. Allergies  Allergen Reactions  . Cortisone     Other reaction(s): itching   . Lactose Intolerance (Gi) Other (See Comments)    Upset stomach  . Penicillins Other (See Comments)    unknown   Current Meds  Medication Sig  . dicyclomine (BENTYL) 20 MG tablet Take 1 tablet (20 mg total) by mouth every 6 (six) hours as needed for spasms (abdominal pain).  Marland Kitchen PEG-KCl-NaCl-NaSulf-Na Asc-C (PLENVU) 140 g SOLR Take 1 kit by mouth as directed.  . polyethylene glycol (MIRALAX / GLYCOLAX) 17 g packet  Take 17 g by mouth daily as needed.  . psyllium (METAMUCIL) 58.6 % packet Take 1 packet by mouth daily.   Past Medical History:  Diagnosis Date  . Acne rosacea   . COVID-19    history of 06/20/2021  . Postpartum depression    Past Surgical History:  Procedure Laterality Date  . HERNIA REPAIR    . LAPAROSCOPIC APPENDECTOMY N/A 09/16/2020   Procedure: APPENDECTOMY LAPAROSCOPIC;  Surgeon: Clovis Riley, MD;  Location: [facility];  Service: General;  Laterality: N/A;  . TUBAL LIGATION     Social History   Social History Narrative   Patient is married to her sons born in 2000-2005 and a daughter born in 44.   Originally from [place].   Self-employed as a Chemical engineer.   No alcohol no caffeine no drug use no tobacco   family history includes Breast cancer in her sister.   Review of Systems As per HPI  Objective:   Physical Exam $RemoveBefor'@BP'xWfzCnXwGWat$  98/60   Pulse 77   Ht $R'5\' 2"'zU$  (1.575 m)   Wt 113 lb (51.3 kg)   LMP 09/17/2020   SpO2 96%   BMI 20.67 kg/m @  General:  Well-developed, well-nourished and in no acute distress Eyes:  anicteric. Lungs: Clear to auscultation bilaterally. Heart:  S1S2, no rubs, murmurs, gallops. Abdomen:  soft, tender in right lower quadrant which is significantly increased with muscle tension-positive Coronet's, no hepatosplenomegaly, hernia, or mass and BS+.  Rectal: Deferred until colonoscopy Extremities:   no edema, cyanosis or clubbing Skin   no rash. Neuro:  A&O x 3.  Psych:  appropriate mood and  Affect.   Data Reviewed:  Primary care notes labs Hospital records etc. as per HPI

## 2020-10-18 NOTE — Patient Instructions (Addendum)
You have been scheduled for a colonoscopy. Please follow written instructions given to you at your visit today.  Please pick up your prep supplies at the pharmacy within the next 1-3 days. If you use inhalers (even only as needed), please bring them with you on the day of your procedure. Use the sample.  We have sent the following medications to your pharmacy for you to pick up at your convenience: Dicyclomine   I appreciate the opportunity to care for you. Silvano Rusk, MD, [facility]

## 2020-11-08 ENCOUNTER — Encounter: Payer: Self-pay | Admitting: Internal Medicine

## 2020-11-08 ENCOUNTER — Other Ambulatory Visit: Payer: Self-pay

## 2020-11-08 ENCOUNTER — Ambulatory Visit (AMBULATORY_SURGERY_CENTER): Payer: Self-pay | Admitting: Internal Medicine

## 2020-11-08 VITALS — BP 115/59 | HR 73 | Temp 98.0°F | Resp 16 | Ht 62.0 in | Wt 113.0 lb

## 2020-11-08 DIAGNOSIS — D508 Other iron deficiency anemias: Secondary | ICD-10-CM

## 2020-11-08 DIAGNOSIS — K51411 Inflammatory polyps of colon with rectal bleeding: Secondary | ICD-10-CM

## 2020-11-08 DIAGNOSIS — D120 Benign neoplasm of cecum: Secondary | ICD-10-CM

## 2020-11-08 DIAGNOSIS — K625 Hemorrhage of anus and rectum: Secondary | ICD-10-CM

## 2020-11-08 DIAGNOSIS — K5140 Inflammatory polyps of colon without complications: Secondary | ICD-10-CM

## 2020-11-08 DIAGNOSIS — K648 Other hemorrhoids: Secondary | ICD-10-CM

## 2020-11-08 MED ORDER — SODIUM CHLORIDE 0.9 % IV SOLN: 500.0000 mL | Freq: Once | INTRAVENOUS | Status: DC

## 2020-11-08 NOTE — Patient Instructions (Addendum)
Vi unas hemorroides y me quitaron in pequeno polipo.  Sin cancer, nada malo  Les [employer] y los vere en julio en la oficina  Gatha Mayer, MD, FACG USTED TUVO UN PROCEDIMIENTO ENDOSCPICO HOY EN EL [facility]:   Lea el informe del procedimiento que se le entreg para cualquier pregunta especfica sobre lo que se encontr durante su examen.  Si el informe del examen no responde a sus preguntas, por favor llame a su gastroenterlogo para aclararlo.  Si usted solicit que no se le den [employer] de lo que se Estate manager/land agent en su procedimiento al [employer] va a cuidar, entonces el informe del procedimiento se ha incluido en un sobre sellado para que usted lo revise despus cuando le sea ms conveniente.   LO QUE PUEDE ESPERAR: Algunas sensaciones de hinchazn en el abdomen.  Puede tener ms gases de lo normal.  El caminar puede ayudarle a eliminar el aire que se le puso en el tracto gastrointestinal durante el procedimiento y reducir la hinchazn.  Si le hicieron una endoscopia inferior (como una colonoscopia o una sigmoidoscopia flexible), podra notar manchas de sangre en las heces fecales o en el papel higinico.  Si se someti a una preparacin intestinal para su procedimiento, es posible que no tenga una evacuacin intestinal normal durante [employer].   Tenga en cuenta:  Es posible que note un poco de irritacin y congestin en la nariz o algn drenaje.  Esto es debido al oxgeno [employer] durante su procedimiento.  No hay que preocuparse y esto debe desaparecer ms o Scientist, research (medical).   SNTOMAS PARA REPORTAR INMEDIATAMENTE:  Despus de una endoscopia inferior (colonoscopia o sigmoidoscopia flexible):  Cantidades excesivas de sangre en las heces fecales  Sensibilidad significativa o empeoramiento de los dolores abdominales   Hinchazn aguda del abdomen que antes no tena   Fiebre de 100F o ms   Despus de la endoscopia superior (EGD)  Vmitos de Biochemist, clinical o  material como caf molido   Dolor en el pecho o dolor debajo de los omplatos que antes no tena   Dolor o dificultad persistente para tragar  Falta de aire que antes no tena   Fiebre de 100F o ms  Heces fecales negras y pegajosas   Para asuntos urgentes o de Freight forwarder, puede comunicarse con un gastroenterlogo a cualquier hora llamando al [phone].  DIETA:  Recomendamos una comida pequea al principio, pero luego puede continuar con su dieta normal.  Tome muchos lquidos, Teacher, adult education las bebidas alcohlicas durante 24 horas.    ACTIVIDAD:  Debe planear tomarse las cosas con calma por el resto del da y no debe CONDUCIR ni usar maquinaria pesada Programmer, applications (debido a los medicamentos de sedacin utilizados durante el examen).     SEGUIMIENTO: Nuestro personal llamar al nmero que aparece en su historial al siguiente da hbil de su procedimiento para ver cmo se siente y para responder cualquier pregunta o inquietud que pueda tener con respecto a la informacin que se le dio despus del procedimiento. Si no podemos contactarle, le dejaremos un mensaje.  Sin embargo, si se siente bien y no tiene Paediatric nurse, no es necesario que nos devuelva la llamada.  Asumiremos que ha regresado a sus actividades diarias normales sin incidentes. Si se le tomaron algunas biopsias, le contactaremos por telfono o por carta en las prximas 3 semanas.  Si no ha sabido [employer] biopsias en el transcurso de 3  semanas, por favor llmenos al [phone].   FIRMAS/CONFIDENCIALIDAD: Usted y/o el acompaante que le cuide han firmado documentos que se ingresarn en su historial mdico Emergency planning/management officer.  Estas firmas atestiguan el hecho de que la informacin anterior YOU HAD AN ENDOSCOPIC PROCEDURE TODAY AT [facility]:   Refer to the procedure report that was given to you for any specific questions about what was found during the examination.  If the procedure report does not answer  your questions, please call your gastroenterologist to clarify.  If you requested that your care partner not be given the details of your procedure findings, then the procedure report has been included in a sealed envelope for you to review at your convenience later.  YOU SHOULD EXPECT: Some feelings of bloating in the abdomen. Passage of more gas than usual.  Walking can help get rid of the air that was put into your GI tract during the procedure and reduce the bloating. If you had a lower endoscopy (such as a colonoscopy or flexible sigmoidoscopy) you may notice spotting of blood in your stool or on the toilet paper. If you underwent a bowel prep for your procedure, you may not have a normal bowel movement for a few days.  Please Note:  You might notice some irritation and congestion in your nose or some drainage.  This is from the oxygen used during your procedure.  There is no need for concern and it should clear up in a day or so.  SYMPTOMS TO REPORT IMMEDIATELY:   Following lower endoscopy (colonoscopy or flexible sigmoidoscopy):  Excessive amounts of blood in the stool  Significant tenderness or worsening of abdominal pains  Swelling of the abdomen that is new, acute  Fever of 100F or higher   For urgent or emergent issues, a gastroenterologist can be reached at any hour by calling [phone]. Do not use MyChart messaging for urgent concerns.    DIET:  We do recommend a small meal at first, but then you may proceed to your regular diet.  Drink plenty of fluids but you should avoid alcoholic beverages for 24 hours.  MEDICATIONS: Continue present medications including Dicyclomine and Fiber and Mirilax.  FOLLOW UP: Follow up with Dr. Carlean Purl in his office for an appointment July 19 at 0930 am.  Please see handouts given to you by your recovery nurse.  Thank you for allowing [place] to provide for your healthcare needs today.  ACTIVITY:  You should plan to take it easy for the rest  of today and you should NOT DRIVE or use heavy machinery until tomorrow (because of the sedation medicines used during the test).    FOLLOW UP: Our staff will call the number listed on your records 48-72 hours following your procedure to check on you and address any questions or concerns that you may have regarding the information given to you following your procedure. If we do not reach you, we will leave a message.  We will attempt to reach you two times.  During this call, we will ask if you have developed any symptoms of COVID 19. If you develop any symptoms (ie: fever, flu-like symptoms, shortness of breath, cough etc.) before then, please call [phone].  If you test positive for Covid 19 in the 2 weeks post procedure, please call and report this information to [place].    If any biopsies were taken you will be contacted by phone or by letter within the next 1-3 weeks.  Please  call us at [phone] if you have not heard about the biopsies in 3 weeks.    SIGNATURES/CONFIDENTIALITY: You and/or your care partner have signed paperwork which will be entered into your electronic medical record.  These signatures attest to the fact that that the information above on your After Visit Summary has been reviewed and is understood.  Full responsibility of the confidentiality of this discharge information lies with you and/or your care-partner.

## 2020-11-08 NOTE — Progress Notes (Signed)
IV MO, Tara Washington vitals.  Husband Tara Washington with her as interpreter.

## 2020-11-08 NOTE — Op Note (Signed)
[facility] Patient Name: Tara Washington Procedure Date: 11/08/2020 7:36 AM MRN: [id number] Endoscopist: Gatha Mayer , MD Age: 50 Referring MD:  Date of Birth: 04/19/1971 Gender: Female Account #: [id number] Procedure:                Colonoscopy Indications:              Rectal bleeding, Iron deficiency anemia Medicines:                Propofol per Anesthesia, Monitored Anesthesia Care Procedure:                Pre-Anesthesia Assessment:                           - Prior to the procedure, a History and Physical                            was performed, and patient medications and                            allergies were reviewed. The patient's tolerance of                            previous anesthesia was also reviewed. The risks                            and benefits of the procedure and the sedation                            options and risks were discussed with the patient.                            All questions were answered, and informed consent                            was obtained. Prior Anticoagulants: The patient has                            taken no previous anticoagulant or antiplatelet                            agents. ASA Grade Assessment: I - A normal, healthy                            patient. After reviewing the risks and benefits,                            the patient was deemed in satisfactory condition to                            undergo the procedure.                           After obtaining informed consent, the colonoscope  was passed under direct vision. Throughout the                            procedure, the patient's blood pressure, pulse, and                            oxygen saturations were monitored continuously. The                            Olympus CF-HQ190L ([id number]) Colonoscope was                            introduced through the anus and advanced to the the                             terminal ileum, with identification of the                            appendiceal orifice and IC valve. The colonoscopy                            was performed without difficulty. The patient                            tolerated the procedure well. The quality of the                            bowel preparation was excellent. The bowel                            preparation used was Plenvu via split dose                            instruction. The terminal ileum, ileocecal valve,                            appendiceal orifice, and rectum were photographed. Scope In: 7:43:19 AM Scope Out: 2:70:62 AM Scope Withdrawal Time: 0 hours 11 minutes 0 seconds  Total Procedure Duration: 0 hours 13 minutes 20 seconds  Findings:                 The perianal and digital rectal examinations were                            normal.                           A diminutive polyp was found in the appendiceal                            orifice. The polyp was sessile. The polyp was                            removed with a cold snare. Resection and retrieval  were complete. Verification of patient                            identification for the specimen was done. Estimated                            blood loss was minimal.                           Internal hemorrhoids were found.                           The exam was otherwise without abnormality on                            direct and retroflexion views. Complications:            No immediate complications. Estimated Blood Loss:     Estimated blood loss was minimal. Impression:               - One diminutive polyp at the appendiceal orifice,                            removed with a cold snare. Resected and retrieved.                            ? scar from appendectomy                           - Internal hemorrhoids.                           - The examination was otherwise normal on direct                            and  retroflexion views. Recommendation:           - Patient has a contact number available for                            emergencies. The signs and symptoms of potential                            delayed complications were discussed with the                            patient. Return to normal activities tomorrow.                            Written discharge instructions were provided to the                            patient.                           - Resume previous diet.                           -  Continue present medications. dicyclomine and                            fibert and miralax                           - Await pathology results.                           - Repeat colonoscopy is recommended. The                            colonoscopy date will be determined after pathology                            results from today's exam become available for                            review.                           - July f/u scheduled today Gatha Mayer, MD 11/08/2020 8:11:33 AM This report has been signed electronically.

## 2020-11-08 NOTE — Progress Notes (Signed)
Called to room to assist during endoscopic procedure.  Patient ID and intended procedure confirmed with present staff. Received instructions for my participation in the procedure from the performing physician.  

## 2020-11-08 NOTE — Progress Notes (Signed)
PT taken to PACU. Monitors in place. VSS. Report given to RN. 

## 2020-11-10 ENCOUNTER — Telehealth: Payer: Self-pay

## 2020-11-10 ENCOUNTER — Telehealth: Payer: Self-pay | Admitting: *Deleted

## 2020-11-10 NOTE — Telephone Encounter (Signed)
First attempt follow up call to pt, no answer. 

## 2020-11-10 NOTE — Telephone Encounter (Signed)
No answer for post procedure call back. Unable to leave message. 

## 2020-11-21 ENCOUNTER — Encounter: Payer: Self-pay | Admitting: Internal Medicine

## 2020-11-22 ENCOUNTER — Encounter: Payer: Self-pay | Admitting: Physician Assistant

## 2020-11-22 ENCOUNTER — Other Ambulatory Visit: Payer: Self-pay

## 2020-11-22 ENCOUNTER — Ambulatory Visit: Payer: Self-pay | Attending: Physician Assistant | Admitting: Physician Assistant

## 2020-11-22 VITALS — BP 125/83 | HR 59 | Resp 16 | Ht 61.0 in | Wt 117.6 lb

## 2020-11-22 DIAGNOSIS — Z09 Encounter for follow-up examination after completed treatment for conditions other than malignant neoplasm: Secondary | ICD-10-CM

## 2020-11-22 DIAGNOSIS — R739 Hyperglycemia, unspecified: Secondary | ICD-10-CM

## 2020-11-22 DIAGNOSIS — Z9049 Acquired absence of other specified parts of digestive tract: Secondary | ICD-10-CM

## 2020-11-22 DIAGNOSIS — Z789 Other specified health status: Secondary | ICD-10-CM

## 2020-11-22 DIAGNOSIS — E876 Hypokalemia: Secondary | ICD-10-CM

## 2020-11-22 DIAGNOSIS — Z1231 Encounter for screening mammogram for malignant neoplasm of breast: Secondary | ICD-10-CM

## 2020-11-22 DIAGNOSIS — D649 Anemia, unspecified: Secondary | ICD-10-CM

## 2020-11-22 NOTE — Progress Notes (Signed)
Patient ID: Tara Washington, female   DOB: 1971-02-15, 50 y.o.   MRN: [id number]    Tara Washington, is a 50 y.o. female  YNW:[id number]  [id number]:[id number]  DOB - [birth year]  Subjective:  Chief Complaint and HPI: Tara Washington is a 50 y.o. female here today to establish care and for a follow up visit After hospitalization 3/18-3/24/2022 for appendectomy.  She still has some pain in the area on and off.  Appetite is normal.  Some nausea on and off for years. Colonoscopy recently done and normal other than benign polyp.    H/o prediabetes and no A1C in a while.  H/o anemia.  Periods normal since surgery.    Needs mammogram-has not had one in ~8 years.    From discharge summary: Reason for Admission: This is a 50 year old female who presented to [facility] with two days of abdominal pain, worse on the right side. Some nausea/ no vomiting. No urinary symptoms. CT scan revealed findings consistent with acute appendicitis. She is being transferred to [facility] for management.   Procedures Dr. Kae Heller - Laparoscopic Appendectomy - 09/16/2020  Hospital Course:  Patient was admitted to the general surgery service after she presented [facility] with 2 days of abdominal pain and was found to have acute appendicitis on CT.  Patient was taken to the operating room on 3/19 by Dr. Kae Heller and underwent laparoscopic appendectomy.  Intra-Op patient was found to have Acute appendicitis with diffuse pus throughout the abdomen/purulent peritonitis, adhesions noted between the posterior uterus/right ovary and rectum but no obvious tubo-ovarian abscess.  Patient remained on antibiotics postop. Patient developed an ileus postoperatively.  This resolved and diet was advanced and tolerated. On POD #5, the patient was voiding well, tolerating diet, ambulating well, pain well controlled, vital signs stable, incisions c/d/i and felt stable for discharge home. Follow up as noted below.   We discussed post-op instructions, lifting precautions at home and not to drive/operate heavy machine while taking narcotic pain medications.  She declined a note for work.  Return precautions were advised.  To note - patient did mention a history of intermittent attacks of lower abdominal pain, nausea, vomiting, fever over the last 2-3 years.  She was recommended follow-up with GI as an outpatient to determine if upper/lower endoscopies would be helpful for further evaluation.  Please see Dr. Ron Parker note on 3/20 for more details.   ED/Hospital notes reviewed.   Social History: Family history:  ROS:   Constitutional:  No f/c, No night sweats, No unexplained weight loss. EENT:  No vision changes, No blurry vision, No hearing changes. No mouth, throat, or ear problems.  Respiratory: No cough, No SOB Cardiac: No CP, no palpitations GI:  No abd pain, No N/V/D. GU: No Urinary s/sx Musculoskeletal: No joint pain Neuro: No headache, no dizziness, no motor weakness.  Skin: No rash Endocrine:  No polydipsia. No polyuria.  Psych: Denies SI/HI  No problems updated.  ALLERGIES: Allergies  Allergen Reactions  . Cortisone     Other reaction(s): itching   . Lactose Intolerance (Gi) Other (See Comments)    Upset stomach  . Penicillins Other (See Comments)    unknown    PAST MEDICAL HISTORY: Past Medical History:  Diagnosis Date  . Acne rosacea   . COVID-19    history of 06/20/2021  . Postpartum depression     MEDICATIONS AT HOME: Prior to Admission medications   Medication Sig Start Date End Date Taking? Authorizing Provider  dicyclomine (BENTYL) 20  MG tablet Take 1 tablet (20 mg total) by mouth every 6 (six) hours as needed for spasms (abdominal pain). Patient not taking: Reported on 11/22/2020 10/18/20   Gatha Mayer, MD  polyethylene glycol (MIRALAX / GLYCOLAX) 17 g packet Take 17 g by mouth daily as needed. Patient not taking: Reported on 11/22/2020 09/21/20   Jillyn Ledger, PA-C  psyllium (METAMUCIL) 58.6 % packet Take 1 packet by mouth daily. Patient not taking: Reported on 11/22/2020    [provider]     Objective:  EXAM:   Vitals:   11/22/20 0913  BP: 125/83  Pulse: (!) 59  Resp: 16  SpO2: 97%  Weight: 117 lb 9.6 oz (53.3 kg)  Height: 5\' 1"  (1.549 m)    General appearance : A&OX3. NAD. Non-toxic-appearing HEENT: Atraumatic and Normocephalic.  PERRLA. EOM intact.   Chest/Lungs:  Breathing-non-labored, Good air entry bilaterally, breath sounds normal without rales, rhonchi, or wheezing  CVS: S1 S2 regular, no murmurs, gallops, rubs  Extremities: Bilateral Lower Ext shows no edema, both legs are warm to touch with = pulse throughout Neurology:  CN II-XII grossly intact, Non focal.   Psych:  TP linear. J/I WNL. Normal speech. Appropriate eye contact and affect.  Skin:  No Rash  Data Review No results found for: HGBA1C   Assessment & Plan   1. Anemia, unspecified type - CBC with Differential/Platelet - Iron, TIBC and Ferritin Panel  2. Encounter for screening mammogram for malignant neoplasm of breast - MM Digital Screening; Future  3. Hyperglycemia I have had a lengthy discussion and provided education about insulin resistance and the intake of too much sugar/refined carbohydrates.  I have advised the patient to work at a goal of eliminating sugary drinks, candy, desserts, sweets, refined sugars, processed foods, and white carbohydrates.  The patient expresses understanding.   - Hemoglobin A1c  4. Hospital discharge follow-up Doing well   5. Hypokalemia - Comprehensive metabolic panel  6. S/P laparoscopic appendectomy - Comprehensive metabolic panel  7. Language barrier [employer] interpreters(Viviana) used and additional time performing visit was required.   Patient have been counseled extensively about nutrition and exercise  Return in about 3 months (around 02/22/2021) for assign PCP.  The patient was given clear  instructions to go to ER or return to medical center if symptoms don't improve, worsen or new problems develop. The patient verbalized understanding. The patient was told to call to get lab results if they haven't heard anything in the next week.     Freeman Caldron, PA-C [facility] and [facility] [place], [place]   11/22/2020, 9:31 AM

## 2020-11-23 LAB — COMPREHENSIVE METABOLIC PANEL
ALT: 12 IU/L (ref 0–32)
AST: 13 IU/L (ref 0–40)
Albumin/Globulin Ratio: 1.5 (ref 1.2–2.2)
Albumin: 4.5 g/dL (ref 3.8–4.8)
Alkaline Phosphatase: 80 IU/L (ref 44–121)
BUN/Creatinine Ratio: 20 (ref 9–23)
BUN: 13 mg/dL (ref 6–24)
Bilirubin Total: 0.2 mg/dL (ref 0.0–1.2)
CO2: 22 mmol/L (ref 20–29)
Calcium: 9.6 mg/dL (ref 8.7–10.2)
Chloride: 102 mmol/L (ref 96–106)
Creatinine, Ser: 0.64 mg/dL (ref 0.57–1.00)
Globulin, Total: 3.1 g/dL (ref 1.5–4.5)
Glucose: 91 mg/dL (ref 65–99)
Potassium: 4.5 mmol/L (ref 3.5–5.2)
Sodium: 138 mmol/L (ref 134–144)
Total Protein: 7.6 g/dL (ref 6.0–8.5)
eGFR: 108 mL/min/{1.73_m2} (ref >59)

## 2020-11-23 LAB — CBC WITH DIFFERENTIAL/PLATELET
Basophils Absolute: 0 10*3/uL (ref 0.0–0.2)
Basos: 1 %
EOS (ABSOLUTE): 0.2 10*3/uL (ref 0.0–0.4)
Eos: 4 %
Hematocrit: 37.3 % (ref 34.0–46.6)
Hemoglobin: 11.8 g/dL (ref 11.1–15.9)
Immature Grans (Abs): 0 10*3/uL (ref 0.0–0.1)
Immature Granulocytes: 0 %
Lymphocytes Absolute: 1.6 10*3/uL (ref 0.7–3.1)
Lymphs: 28 %
MCH: 27.3 pg (ref 26.6–33.0)
MCHC: 31.6 g/dL (ref 31.5–35.7)
MCV: 86 fL (ref 79–97)
Monocytes Absolute: 0.4 10*3/uL (ref 0.1–0.9)
Monocytes: 8 %
Neutrophils Absolute: 3.5 10*3/uL (ref 1.4–7.0)
Neutrophils: 59 %
Platelets: 365 10*3/uL (ref 150–450)
RBC: 4.33 x10E6/uL (ref 3.77–5.28)
RDW: 13.1 % (ref 11.7–15.4)
WBC: 5.8 10*3/uL (ref 3.4–10.8)

## 2020-11-23 LAB — IRON,TIBC AND FERRITIN PANEL
Ferritin: 13 ng/mL — ABNORMAL LOW (ref 15–150)
Iron Saturation: 12 % — ABNORMAL LOW (ref 15–55)
Iron: 43 ug/dL (ref 27–159)
Total Iron Binding Capacity: 370 ug/dL (ref 250–450)
UIBC: 327 ug/dL (ref 131–425)

## 2020-11-23 LAB — HEMOGLOBIN A1C
Est. average glucose Bld gHb Est-mCnc: 126 mg/dL
Hgb A1c MFr Bld: 6 % — ABNORMAL HIGH (ref 4.8–5.6)

## 2020-12-22 ENCOUNTER — Other Ambulatory Visit: Payer: Self-pay

## 2020-12-22 DIAGNOSIS — N6310 Unspecified lump in the right breast, unspecified quadrant: Secondary | ICD-10-CM

## 2020-12-22 NOTE — Progress Notes (Signed)
Reast lum

## 2021-01-16 ENCOUNTER — Encounter: Payer: Self-pay | Admitting: Internal Medicine

## 2021-01-16 ENCOUNTER — Ambulatory Visit: Payer: Self-pay | Admitting: Internal Medicine

## 2021-01-16 VITALS — BP 120/70 | HR 84 | Ht 60.0 in | Wt 119.0 lb

## 2021-01-16 DIAGNOSIS — R109 Unspecified abdominal pain: Secondary | ICD-10-CM

## 2021-01-16 DIAGNOSIS — K581 Irritable bowel syndrome with constipation: Secondary | ICD-10-CM

## 2021-01-16 DIAGNOSIS — R0789 Other chest pain: Secondary | ICD-10-CM

## 2021-01-16 MED ORDER — IBUPROFEN 600 MG PO TABS: 600.0000 mg | ORAL_TABLET | Freq: Three times a day (TID) | ORAL | 0 refills | Status: AC

## 2021-01-16 MED ORDER — METHOCARBAMOL 750 MG PO TABS: 750.0000 mg | ORAL_TABLET | Freq: Every day | ORAL | 0 refills | Status: AC

## 2021-01-16 NOTE — Progress Notes (Signed)
Tara Washington 50 y.o. 1971/01/20 [id number]  Assessment & Plan:   Encounter Diagnoses  Name Primary?   Abdominal wall pain Yes   Chest wall pain    Irritable bowel syndrome with constipation     Meds ordered this encounter  Medications   ibuprofen (ADVIL) 600 MG tablet    Sig: Take 1 tablet (600 mg total) by mouth 3 (three) times daily.    Dispense:  90 tablet    Refill:  0   methocarbamol (ROBAXIN) 750 MG tablet    Sig: Take 1 tablet (750 mg total) by mouth at bedtime.    Dispense:  30 tablet    Refill:  0    Salonpas is also an option to use.  I explained that this should get better with time and that there is nothing worrisome here.  Follow-up as needed.  Continue increased fiber ingestion for IBS-C.   Subjective:   Chief Complaint: Right upper quadrant and chest wall pain constipation  HPI Colonoscopy for iron deficiency anemia on 11/08/2020 with an inflammatory polyp.  Returns for discussion of constipation.  I had seen her originally in April and thought she had IBS most likely, she also had some iron deficiency anemia that I thought was most likely menstrual in origin though there was some rectal bleeding, and she had abdominal wall pain.  At this point she is moving her bowels pretty much every day by eating fruit and increasing fiber and that is much better.  There is still some postprandial bloating which is bothersome.  Her main issue is a constant pain in the right upper quadrant flank and infrascapular area that is intensified by moving or coughing or twisting.  It is also worse when she lies down at night.  She does not recall any injury. Wt Readings from Last 3 Encounters:  01/16/21 119 lb (54 kg)  11/22/20 117 lb 9.6 oz (53.3 kg)  11/08/20 113 lb (51.3 kg)   Review of systems notable for upcoming breast lump evaluation with imaging Allergies  Allergen Reactions   Cortisone     Other reaction(s): itching    Lactose Intolerance  (Gi) Other (See Comments)    Upset stomach   Penicillins Other (See Comments)    unknown   Current Meds             None currently Past Medical History:  Diagnosis Date   Acne rosacea    Anemia    COVID-19    history of 06/20/2021   Postpartum depression    Past Surgical History:  Procedure Laterality Date   HERNIA REPAIR     LAPAROSCOPIC APPENDECTOMY N/A 09/16/2020   Procedure: APPENDECTOMY LAPAROSCOPIC;  Surgeon: Clovis Riley, MD;  Location: [facility] OR;  Service: General;  Laterality: N/A;   TUBAL LIGATION     Social History   Social History Narrative   Patient is married to her sons born in 2000-2005 and a daughter born in 66.   Originally from [place].   Self-employed as a Chemical engineer.   No alcohol no caffeine no drug use no tobacco   family history includes Breast cancer in her sister.   Review of Systems See HPI  Objective:   Physical Exam BP 120/70   Pulse 84   Ht 5' (1.524 m)   Wt 119 lb (54 kg)   SpO2 99%   BMI 23.24 kg/m  Patient is exquisitely tender over the right anterior lower lateral and posterior ribs some  right upper quadrant abdominal wall pain as well there is a positive Carnett's sign

## 2021-01-16 NOTE — Patient Instructions (Signed)
We are providing you with printed rx's today for Ibuprofen-take for a month and methocarbamol (a muscle relaxer) to take at bedtime.  You may try over the counter Salon Pas for the chest wall pain.  I appreciate the opportunity to care for you. Silvano Rusk, MD, [facility]

## 2021-02-08 ENCOUNTER — Ambulatory Visit
Admission: RE | Admit: 2021-02-08 | Discharge: 2021-02-08 | Disposition: A | Discharge status: Home / Self Care | Payer: No Typology Code available for payment source | Source: Ambulatory Visit | Attending: Obstetrics and Gynecology | Admitting: Obstetrics and Gynecology

## 2021-02-08 ENCOUNTER — Other Ambulatory Visit: Payer: Self-pay

## 2021-02-08 ENCOUNTER — Ambulatory Visit: Payer: Self-pay | Admitting: *Deleted

## 2021-02-08 ENCOUNTER — Other Ambulatory Visit: Payer: Self-pay | Admitting: Obstetrics and Gynecology

## 2021-02-08 VITALS — BP 110/72 | Wt 120.4 lb

## 2021-02-08 DIAGNOSIS — N6313 Unspecified lump in the right breast, lower outer quadrant: Secondary | ICD-10-CM

## 2021-02-08 DIAGNOSIS — N6310 Unspecified lump in the right breast, unspecified quadrant: Secondary | ICD-10-CM

## 2021-02-08 DIAGNOSIS — N63 Unspecified lump in unspecified breast: Secondary | ICD-10-CM

## 2021-02-08 DIAGNOSIS — Z01419 Encounter for gynecological examination (general) (routine) without abnormal findings: Secondary | ICD-10-CM

## 2021-02-08 DIAGNOSIS — N644 Mastodynia: Secondary | ICD-10-CM

## 2021-02-08 DIAGNOSIS — N6321 Unspecified lump in the left breast, upper outer quadrant: Secondary | ICD-10-CM

## 2021-02-08 NOTE — Progress Notes (Addendum)
[name] is a 50 y.o. No obstetric history on file. female who presents to [facility] clinic today with complaint of bilateral breast lumps and pain x 4 months. Patient states the pain comes and goes. Patient states the pain is greater within the left breast. Patient rates pain within the left breast at 7 out of 10 and right breast 4-5 out of 10.   Patient complained of heavy menstrual periods lasting longer than one week. Referred patient to the [facility] for [place] for follow-up. Let patient know that it will not be covered by [facility] and patient was given the [facility] Application. Patient verbalized understanding. Referral sent today.   Pap Smear: Pap smear completed today. Last Pap smear was 08/11/2013 at [facility] clinic and was normal. Per patient has no history of an abnormal Pap smear. Last Pap smear result is available in Epic.   Physical exam: Breasts Breasts symmetrical. No skin abnormalities bilateral breasts. No nipple retraction bilateral breasts. No nipple discharge bilateral breasts. No lymphadenopathy. Palpated a lump within the right breast at 7 o'clock 3 cm from the nipple. Palpated a lump within the left breast at 2 o'clock 1 cm from the nipple. Complaints of bilateral outer breat pain on exam.  MS DIGITAL DIAG TOMO BILAT  Result Date: 02/08/2021 CLINICAL DATA:  Screening recall for bilateral palpable lumps. EXAM: DIGITAL DIAGNOSTIC BILATERAL MAMMOGRAM WITH TOMOSYNTHESIS AND CAD; ULTRASOUND LEFT BREAST LIMITED; ULTRASOUND RIGHT BREAST LIMITED TECHNIQUE: Bilateral digital diagnostic mammography and breast tomosynthesis was performed. The images were evaluated with computer-aided detection.; Targeted ultrasound examination of the left breast was performed.; Targeted ultrasound examination of the right breast was performed COMPARISON:  Previous exam(s). ACR Breast Density Category d: The breast tissue is extremely  dense, which lowers the sensitivity of mammography. FINDINGS: BBs indicating palpable sites have been placed on each breast; one on the inferior anterior right breast, and another on the upper outer anterior left breast. Deep to these markers, and elsewhere scattered through the breast tissue, there are obscured oval masses No suspicious calcifications or areas of distortion are seen in the bilateral breasts. Ultrasound targeted to the palpable site in the right breast at 630, 2 cm from the nipple demonstrates multiple anechoic oval circumscribed masses. The largest at this site measures 2.5 x 1.1 x 2.0 cm. Ultrasound targeted to the palpable site in the left breast at 2:30, 1 cm from the nipple demonstrates multiple anechoic oval circumscribed masses, the largest measuring 2.2 x 1.4 x 1.9 cm. IMPRESSION: 1. The palpable sites in the bilateral breasts correspond with benign cysts. 2.  No mammographic evidence of malignancy in the bilateral breasts. RECOMMENDATION: Screening mammogram in one year.(Code:SM-B-01Y) I have discussed the findings and recommendations with the patient. If applicable, a reminder letter will be sent to the patient regarding the next appointment. BI-RADS CATEGORY  2: Benign. Electronically Signed   By: Ammie Ferrier M.D.   On: 02/08/2021 13:38      Pelvic/Bimanual Ext Genitalia No lesions, no swelling and no discharge observed on external genitalia.        Vagina Vagina pink and normal texture. No lesions or discharge observed in vagina.        Cervix Cervix is present. Cervix pink and of normal texture. No discharge observed.    Uterus Uterus is present and palpable. Uterus in normal position and normal size.        Adnexae Bilateral ovaries present and palpable. No tenderness on  palpation.         Rectovaginal No rectal exam completed today since patient had no rectal complaints. No skin abnormalities observed on exam.     Smoking History: Patient has never  smoked.   Patient Navigation: Patient education provided. Access to services provided for patient through [facility] program. Spanish interpreter Rudene Anda from [facility] provided.   Colorectal Cancer Screening: Per patient has had colonoscopy completed on 11/08/2020 at [facility].  No complaints today.    Breast and Cervical Cancer Risk Assessment: Patient has family history of paternal half sister having breast cancer. Patient has no known genetic mutations or history of radiation treatment to the chest before age 24. Patient does not have history of cervical dysplasia, immunocompromised, or DES exposure in-utero.  Risk Assessment     Risk Scores       02/08/2021   Last edited by: Demetrius Revel, LPN   5-year risk: 1.4 %   Lifetime risk: 12.1 %            A: B[facility]CP exam with pap smear Complaint of bilateral breast lumps and pain.  P: Referred patient to the [facility] for a diagnostic mammogram. Appointment scheduled Thursday, February 08, 2021 at 1240.  Loletta Parish, RN 02/08/2021 10:55 AM

## 2021-02-08 NOTE — Patient Instructions (Signed)
Explained breast self awareness with Tara Washington. Pap smear completed today. Let her know B[facility]CP will cover Pap smears and HPV typing every 5 years unless has a history of abnormal Pap smears. Referred patient to the [facility] for a diagnostic mammogram. Appointment scheduled Thursday, February 08, 2021 at 1240. Patient aware of appointment and will be there. Let patient know will follow up with her within the next couple weeks with results of Pap smear by phone. Tara Washington verbalized understanding.  Tara Washington, Arvil Chaco, RN 10:55 AM

## 2021-02-09 LAB — CYTOLOGY - PAP
Comment: NEGATIVE
Diagnosis: NEGATIVE
High risk HPV: NEGATIVE

## 2021-02-12 ENCOUNTER — Telehealth: Payer: Self-pay

## 2021-02-12 NOTE — Telephone Encounter (Signed)
Via Lennette Bihari # [phone] @ [employer], Patient informed negative Pap/HPV results, next pap due in 5 years. Patient verbalized understanding.

## 2021-02-21 ENCOUNTER — Other Ambulatory Visit: Payer: Self-pay

## 2021-02-21 ENCOUNTER — Ambulatory Visit: Payer: Self-pay | Attending: Family Medicine | Admitting: Family Medicine

## 2021-02-21 VITALS — BP 102/66 | HR 68 | Ht 61.0 in | Wt 120.6 lb

## 2021-02-21 DIAGNOSIS — N924 Excessive bleeding in the premenopausal period: Secondary | ICD-10-CM

## 2021-02-21 DIAGNOSIS — K120 Recurrent oral aphthae: Secondary | ICD-10-CM

## 2021-02-21 DIAGNOSIS — Z1159 Encounter for screening for other viral diseases: Secondary | ICD-10-CM

## 2021-02-21 DIAGNOSIS — R1011 Right upper quadrant pain: Secondary | ICD-10-CM

## 2021-02-21 MED ORDER — LIDOCAINE VISCOUS HCL 2 % MT SOLN: 15.0000 mL | OROMUCOSAL | 0 refills | Status: AC | PRN

## 2021-02-21 NOTE — Progress Notes (Signed)
Subjective:  Patient ID: Tara Washington, female    DOB: [birth year]  Age: 50 y.o. MRN: [id number]  CC: Establish Care and Abdominal Pain   HPI Tara Washington is a 50 y.o. year old female who presents today to establish care. She is accompanied by an in person Spanish interpreter.  Interval History: She complains of 4 month h/o R sided lower rib cage pain and denies preceding trauma. Described as moderate and occurring after her appendectomy in 08/2020 right after her surgery. There are no triggers and she has nausea but no vomiting. Symptoms are worse with fatty foods.  Complains of heavy cycles with clots which started 6 months ago followed by a light periods and then amenorrhea in the last 2 months and this month period was really heavy. Associated with abdominal cramping, numbness and tingling in legs  Has had a sore in her mouth which occurs intermittently but is absent now.  Past Medical History:  Diagnosis Date   Acne rosacea    Anemia    COVID-19    history of 06/20/2021   Postpartum depression     Past Surgical History:  Procedure Laterality Date   APPENDECTOMY     HERNIA REPAIR     LAPAROSCOPIC APPENDECTOMY N/A 09/16/2020   Procedure: APPENDECTOMY LAPAROSCOPIC;  Surgeon: Clovis Riley, MD;  Location: [facility] OR;  Service: General;  Laterality: N/A;   TUBAL LIGATION      Family History  Problem Relation Age of Onset   Cancer Mother        back/vertebral column   Diabetes Mother    Hypertension Father    Lung disease Father    Breast cancer Sister    Colon cancer Neg Hx    Pancreatic cancer Neg Hx    Esophageal cancer Neg Hx    Liver cancer Neg Hx     Allergies  Allergen Reactions   Cortisone     Other reaction(s): itching    Lactose Intolerance (Gi) Other (See Comments)    Upset stomach   Penicillins Other (See Comments)    unknown    Outpatient Medications Prior to Visit  Medication Sig Dispense Refill    ibuprofen (ADVIL) 600 MG tablet Take 1 tablet (600 mg total) by mouth 3 (three) times daily. (Patient not taking: Reported on 02/21/2021) 90 tablet 0   methocarbamol (ROBAXIN) 750 MG tablet Take 1 tablet (750 mg total) by mouth at bedtime. (Patient not taking: Reported on 02/21/2021) 30 tablet 0   No facility-administered medications prior to visit.     ROS Review of Systems  Constitutional:  Negative for activity change, appetite change and fatigue.  HENT:  Negative for congestion, sinus pressure and sore throat.   Eyes:  Negative for visual disturbance.  Respiratory:  Negative for cough, chest tightness, shortness of breath and wheezing.   Cardiovascular:  Negative for chest pain and palpitations.  Gastrointestinal:  Positive for abdominal pain. Negative for abdominal distention and constipation.  Endocrine: Negative for polydipsia.  Genitourinary:  Positive for menstrual problem. Negative for dysuria and frequency.  Musculoskeletal:  Negative for arthralgias and back pain.  Skin:  Negative for rash.  Neurological:  Negative for tremors, light-headedness and numbness.  Hematological:  Does not bruise/bleed easily.  Psychiatric/Behavioral:  Negative for agitation and behavioral problems.    Objective:  BP 102/66   Pulse 68   Ht '5\' 1"'$  (1.549 m)   Wt 120 lb 9.6 oz (54.7 kg)   LMP 01/30/2021 (Exact Date)  SpO2 97%   BMI 22.79 kg/m   BP/Weight 02/21/2021 02/08/2021 [id number]  Systolic BP [id number]  Diastolic BP 66 72 70  Wt. (Lbs) 120.6 120.4 119  BMI 22.79 23.51 23.24      Physical Exam Constitutional:      Appearance: She is well-developed.  HENT:     Head:     Comments: No sore noted in mouth Cardiovascular:     Rate and Rhythm: Normal rate.     Heart sounds: Normal heart sounds. No murmur heard. Pulmonary:     Effort: Pulmonary effort is normal.     Breath sounds: Normal breath sounds. No wheezing or rales.  Chest:     Chest wall: No tenderness.  Abdominal:      General: Bowel sounds are normal. There is no distension.     Palpations: Abdomen is soft. There is no mass.     Tenderness: There is no abdominal tenderness.  Musculoskeletal:        General: Normal range of motion.     Right lower leg: No edema.     Left lower leg: No edema.  Neurological:     Mental Status: She is alert and oriented to person, place, and time.  Psychiatric:        Mood and Affect: Mood normal.    CMP Latest Ref Rng & Units 11/22/2020 10/04/2020 09/19/2020  Glucose 65 - 99 mg/dL 91 93 76  BUN 6 - 24 mg/dL 13 11 <5(L)  Creatinine 0.57 - 1.00 mg/dL 0.64 0.60 0.65  Sodium 134 - 144 mmol/L 138 137 137  Potassium 3.5 - 5.2 mmol/L 4.5 4.6 3.3(L)  Chloride 96 - 106 mmol/L 102 101 108  CO2 20 - 29 mmol/L 22 - 22  Calcium 8.7 - 10.2 mg/dL 9.6 10.3(H) 8.2(L)  Total Protein 6.0 - 8.5 g/dL 7.6 7.7 -  Total Bilirubin 0.0 - 1.2 mg/dL 0.2 <0.2 -  Alkaline Phos 44 - 121 IU/L 80 78 -  AST 0 - 40 IU/L 13 21 -  ALT 0 - 32 IU/L 12 - -    Lipid Panel  No results found for: CHOL, TRIG, HDL, CHOLHDL, VLDL, LDLCALC, LDLDIRECT  CBC    Component Value Date/Time   WBC 5.8 11/22/2020 0939   WBC 7.9 09/19/2020 0314   RBC 4.33 11/22/2020 0939   RBC 2.88 (L) 09/19/2020 0314   HGB 11.8 11/22/2020 0939   HCT 37.3 11/22/2020 0939   PLT 365 11/22/2020 0939   MCV 86 11/22/2020 0939   MCH 27.3 11/22/2020 0939   MCH 28.8 09/19/2020 0314   MCHC 31.6 11/22/2020 0939   MCHC 32.7 09/19/2020 0314   RDW 13.1 11/22/2020 0939   LYMPHSABS 1.6 11/22/2020 0939   EOSABS 0.2 11/22/2020 0939   BASOSABS 0.0 11/22/2020 0939    Lab Results  Component Value Date   HGBA1C 6.0 (H) 11/22/2020    Assessment & Plan:  1. Canker sores oral Advised on intake of vitamin C - lidocaine (XYLOCAINE) 2 % solution; Use as directed 15 mLs in the mouth or throat as needed for mouth pain. Then spit out  Dispense: 100 mL; Refill: 0  2. RUQ pain Will need to exclude cholelithiasis - US Abdomen Limited RUQ  (LIVER/GB); Future  3. Need for hepatitis C screening test - HCV RNA quant rflx ultra or genotyp([facility])  4. Premenopausal menorrhagia Suspicious for premenopausal menorrhagia CBC to exclude anemia If menorrhagia continues with will need pelvic ultrasound to evaluate for fibroids -  CBC with Differential/Platelet - FSH/LH - Anti mullerian hormone - Estradiol    Meds ordered this encounter  Medications   lidocaine (XYLOCAINE) 2 % solution    Sig: Use as directed 15 mLs in the mouth or throat as needed for mouth pain. Then spit out    Dispense:  100 mL    Refill:  0    Follow-up: Return if symptoms worsen or fail to improve.       Charlott Rakes, MD, FAAFP. [facility] and [place], [place]   02/21/2021, 10:45 AM

## 2021-02-21 NOTE — Progress Notes (Signed)
Having pain in right side of abdomen.  Sore in mouth.  Last cycle very heavy.

## 2021-02-21 NOTE — Patient Instructions (Addendum)
[place] aftas son llagas pequeas y dolorosas que aparecen en el interior de la boca. Puede tener una o ms aftas en la parte interna de los labios o las mejillas, la lengua o en cualquier lugar dentro de la boca. Las aftas no pueden transmitirse de persona a Nurse, learning disability (no son contagiosas). Estas llagas son diferentes de las llagas que aparecen en la parte externa de los labios (llagas peribucales o herpes labial). Cules son las causas? Se desconoce la causa de esta afeccin. La afeccin puede transmitirse de padres a hijos (gentica). Qu incrementa el riesgo? Es ms probable que esta afeccin se manifieste en: Mujeres. Adolescentes o personas que tienen entre 24 y 6 aos. Mujeres que estn [place]. Personas que estn bajo mucho estrs emocional. Personas que no reciben el aporte suficiente de hierro o vitamina B. Personas que no se cuidan la boca y los dientes (poseen una higiene bucal deficiente). Personas que tienen una herida dentro de la boca, como despus de un tratamiento dental o por Engineer, manufacturing systems algo duro. Cules son los signos o sntomas? Las aftas suelen comenzar como protuberancias rojas dolorosas. Luego se convierten en pequeas llagas de color blanco, amarillo o gris con bordes rojos. Las llagas pueden ser dolorosas, y el dolor puede volverse ms intenso al comer o beber. Junto con [place] [employer], los sntomas tambin pueden incluir lo siguiente: Cristy Hilts. Fatiga. Inflamacin de los ganglios linfticos del cuello. Cmo se diagnostica? Esta afeccin puede diagnosticarse en funcin de los sntomas y al examinar el interior de la boca. Si tiene aftas con frecuencia o si estn muy extendidas, puede hacerse pruebas, como: Anlisis de sangre para descartar las causas posibles. Hisopado de [place] de lquido de la llaga para detectar una infeccin. Extraccin de una pequea muestra de tejido de la llaga (biopsia) para determinar si es cncer. Cmo se trata? La  mayora de las aftas desaparecen sin tratamiento en aproximadamente 1 semana. Generalmente, el cuidado en el hogar es el nico tratamiento necesario. Los medicamentos de venta libre pueden [employer]. Si las llagas estn muy extendidas, el mdico puede recetarle lo siguiente: Ungento anestsico para Best boy. No utilice geles anestsicos o productos que contengan benzocana en nios menores de 2 aos. Vitaminas. Medicamentos con corticoesteroides. Estos pueden administrarse en pldoras, enjuagues bucales o geles. Enjuague bucal con antibitico. Siga estas instrucciones en su casa:  Aplquese, tome o utilice los medicamentos de venta libre y Editor, commissioning como se lo haya indicado el mdico. Estos [place] y los ungentos. Si le recetaron un enjuague bucal con antibitico, utilcelo como se lo haya indicado el mdico. No deje de usar el antibitico aunque la afeccin mejore. Hasta que las aftas hayan cicatrizado: No beba caf ni jugos ctricos. No consuma alimentos picantes ni salados. Use un enjuague bucal suave de venta [employer] se lo haya recomendado el mdico. Mantenga una buena higiene bucal: Utilice hilo dental todos los [place]. Cepllese los dientes con un cepillo de cerdas [employer] veces por da. Comunquese con un mdico si: Los sntomas no mejoran despus de 2 semanas. Tambin tiene fiebre o los ganglios del cuello inflamados. Tiene aftas con frecuencia. Tiene un afta que se le est agrandando. No puede ingerir alimentos ni bebidas debido a las aftas. Resumen Las aftas son llagas pequeas y dolorosas que aparecen en el interior de la boca. Las aftas generalmente comienzan como protuberancias rojas y dolorosas que luego se convierten en llagas pequeas de color blanco, amarillo o gris  con bordes rojos. Las llagas pueden ser bastante dolorosas, y el dolor puede volverse ms intenso al comer o beber. La mayora de las aftas desaparecen sin  tratamiento en aproximadamente 1 semana. Los medicamentos de venta libre pueden [employer]. Esta informacin no tiene Marine scientist el consejo del mdico. Asegresede hacerle al mdico cualquier pregunta que tenga. Document Revised: 04/19/2020 Document Reviewed: 03/14/2020 Elsevier Patient Education  2022 [employer].

## 2021-02-26 LAB — CBC WITH DIFFERENTIAL/PLATELET
Basophils Absolute: 0 10*3/uL (ref 0.0–0.2)
Basos: 1 %
EOS (ABSOLUTE): 0.1 10*3/uL (ref 0.0–0.4)
Eos: 2 %
Hematocrit: 36.3 % (ref 34.0–46.6)
Hemoglobin: 11.9 g/dL (ref 11.1–15.9)
Immature Grans (Abs): 0 10*3/uL (ref 0.0–0.1)
Immature Granulocytes: 0 %
Lymphocytes Absolute: 1.8 10*3/uL (ref 0.7–3.1)
Lymphs: 39 %
MCH: 27.7 pg (ref 26.6–33.0)
MCHC: 32.8 g/dL (ref 31.5–35.7)
MCV: 84 fL (ref 79–97)
Monocytes Absolute: 0.3 10*3/uL (ref 0.1–0.9)
Monocytes: 7 %
Neutrophils Absolute: 2.4 10*3/uL (ref 1.4–7.0)
Neutrophils: 51 %
Platelets: 334 10*3/uL (ref 150–450)
RBC: 4.3 x10E6/uL (ref 3.77–5.28)
RDW: 14.8 % (ref 11.7–15.4)
WBC: 4.7 10*3/uL (ref 3.4–10.8)

## 2021-02-26 LAB — HCV RNA QUANT RFLX ULTRA OR GENOTYP: HCV Quant Baseline: NOT DETECTED IU/mL

## 2021-02-26 LAB — ESTRADIOL: Estradiol: 27.7 pg/mL

## 2021-02-26 LAB — FSH/LH
FSH: 41.9 m[IU]/mL
LH: 29.7 m[IU]/mL

## 2021-02-26 LAB — ANTI MULLERIAN HORMONE: ANTI-MULLERIAN HORMONE (AMH): 0.017 ng/mL

## 2021-02-27 ENCOUNTER — Other Ambulatory Visit: Payer: Self-pay

## 2021-02-27 ENCOUNTER — Ambulatory Visit (HOSPITAL_COMMUNITY)
Admission: RE | Admit: 2021-02-27 | Discharge: 2021-02-27 | Disposition: A | Discharge status: Home / Self Care | Payer: Self-pay | Source: Ambulatory Visit | Attending: Family Medicine | Admitting: Family Medicine

## 2021-02-27 DIAGNOSIS — R1011 Right upper quadrant pain: Secondary | ICD-10-CM | POA: Insufficient documentation

## 2021-03-13 ENCOUNTER — Telehealth: Payer: Self-pay

## 2021-03-13 NOTE — Telephone Encounter (Signed)
Patient name and DOB has been verified Patient was informed of lab results. Patient had no questions.  

## 2021-03-13 NOTE — Telephone Encounter (Signed)
-----   Message from Charlott Rakes, MD sent at 02/26/2021  8:35 AM EDT ----- Please inform her that her labs are in keeping with menopause.

## 2021-04-03 ENCOUNTER — Encounter: Payer: No Typology Code available for payment source | Admitting: Obstetrics and Gynecology

## 2023-01-21 IMAGING — MG DIGITAL DIAGNOSTIC BILAT W/ TOMO W/ CAD
6 of 12 series · 6 of 36 positions shown · non-contrast
Comparison: Previous exam(s).

CLINICAL DATA: Screening recall for bilateral palpable lumps.



[L CC synth-2D (1 of 2)]
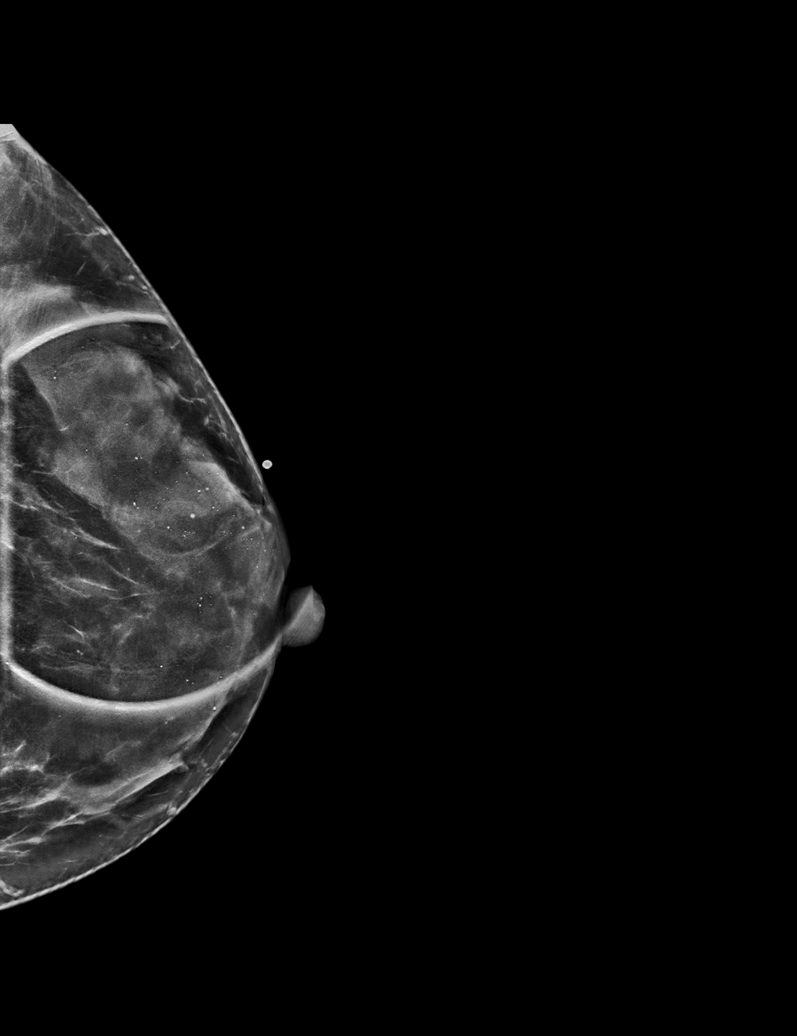

[R CC synth-2D (1 of 2)]
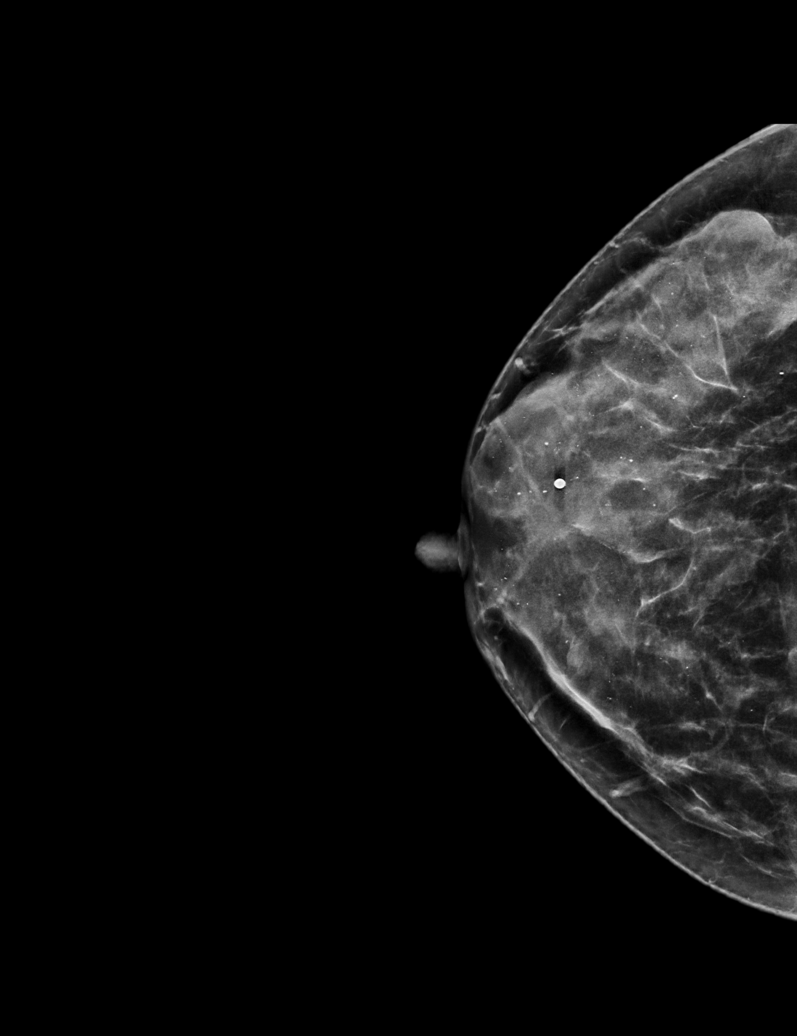

[L MLO synth-2D]
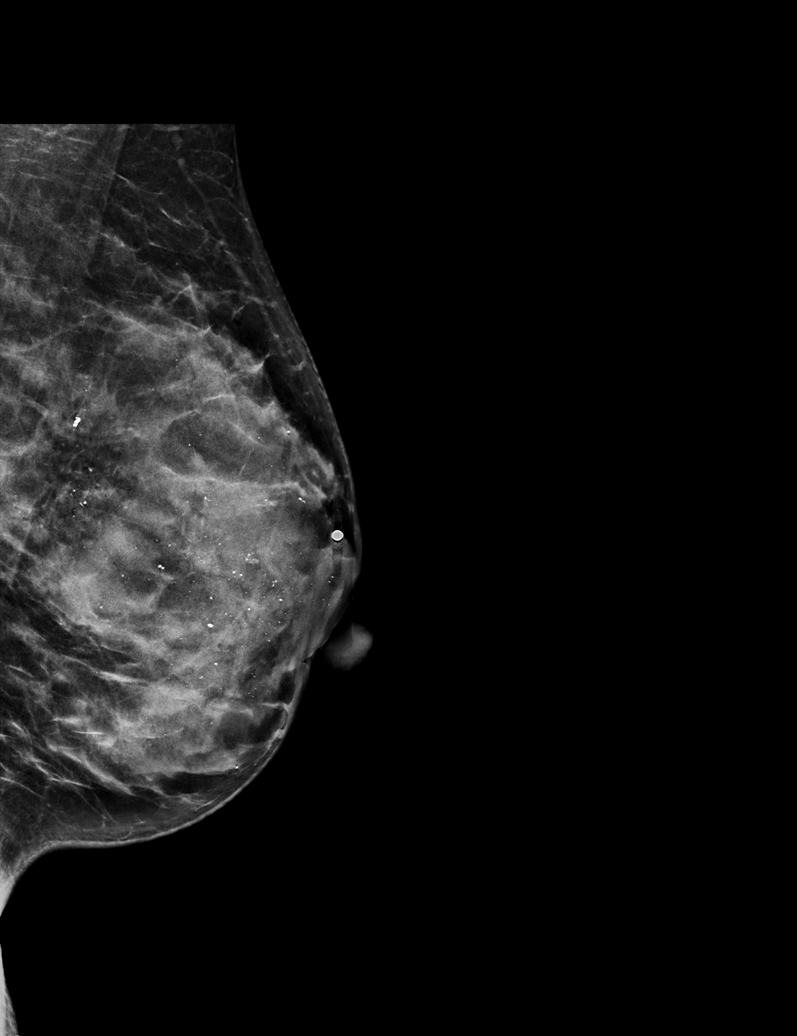

[R MLO synth-2D]
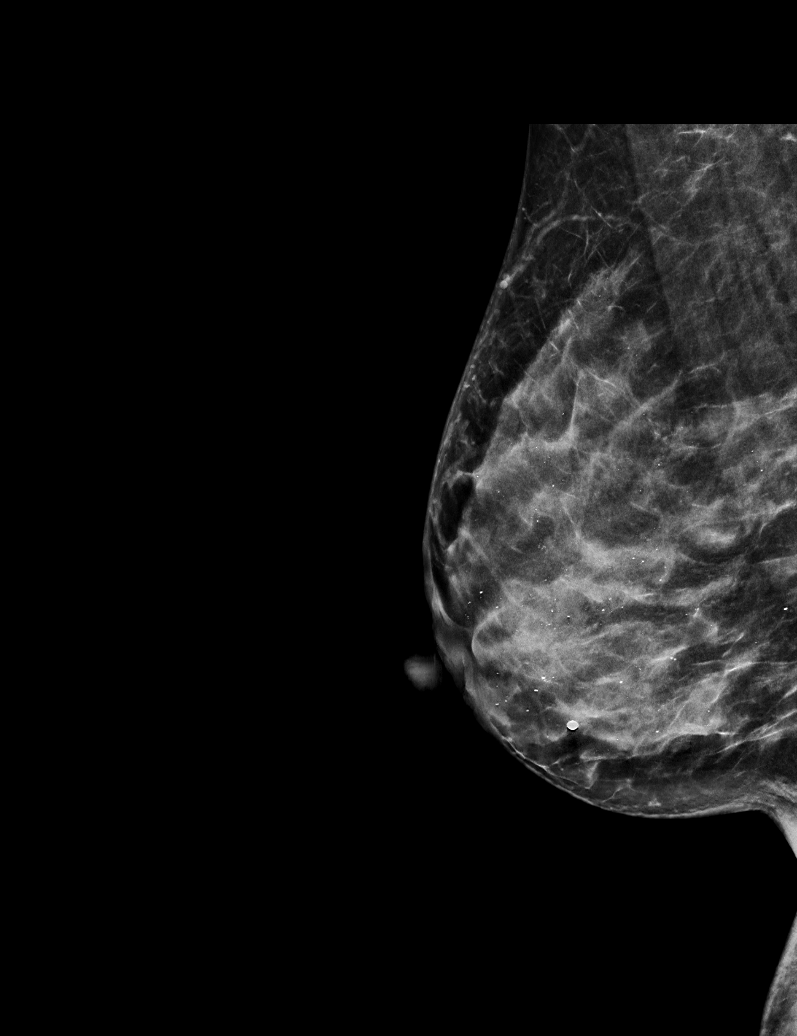

[R CC synth-2D (2 of 2)]
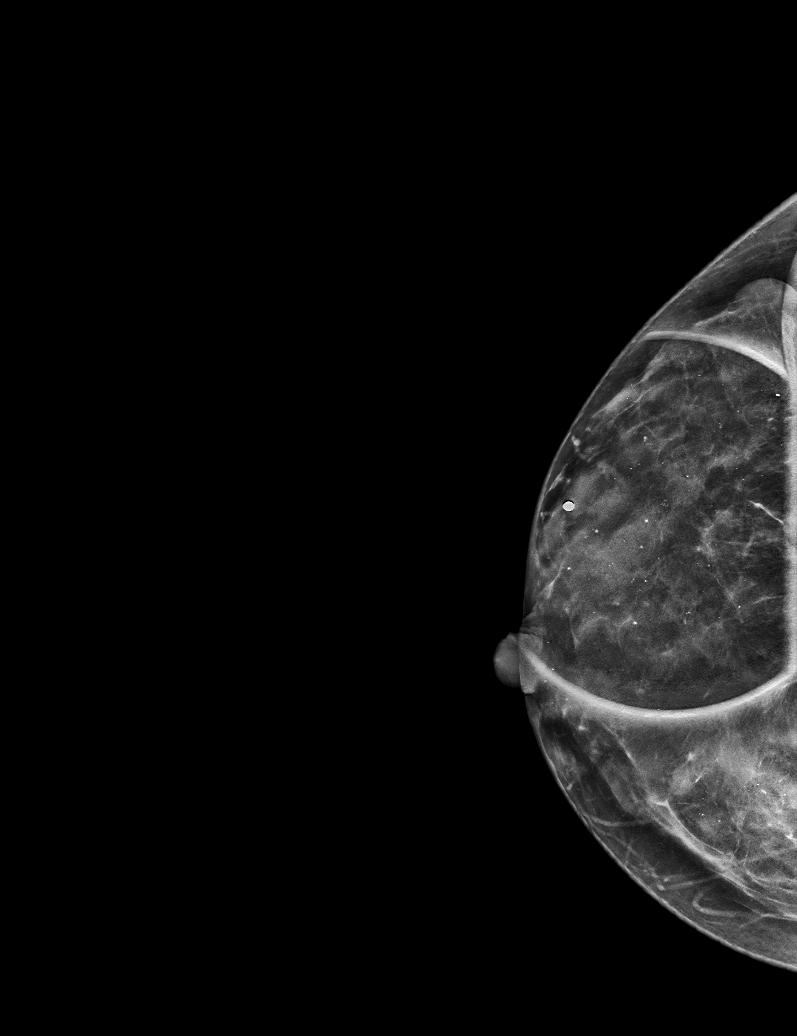

[L CC synth-2D (2 of 2)]
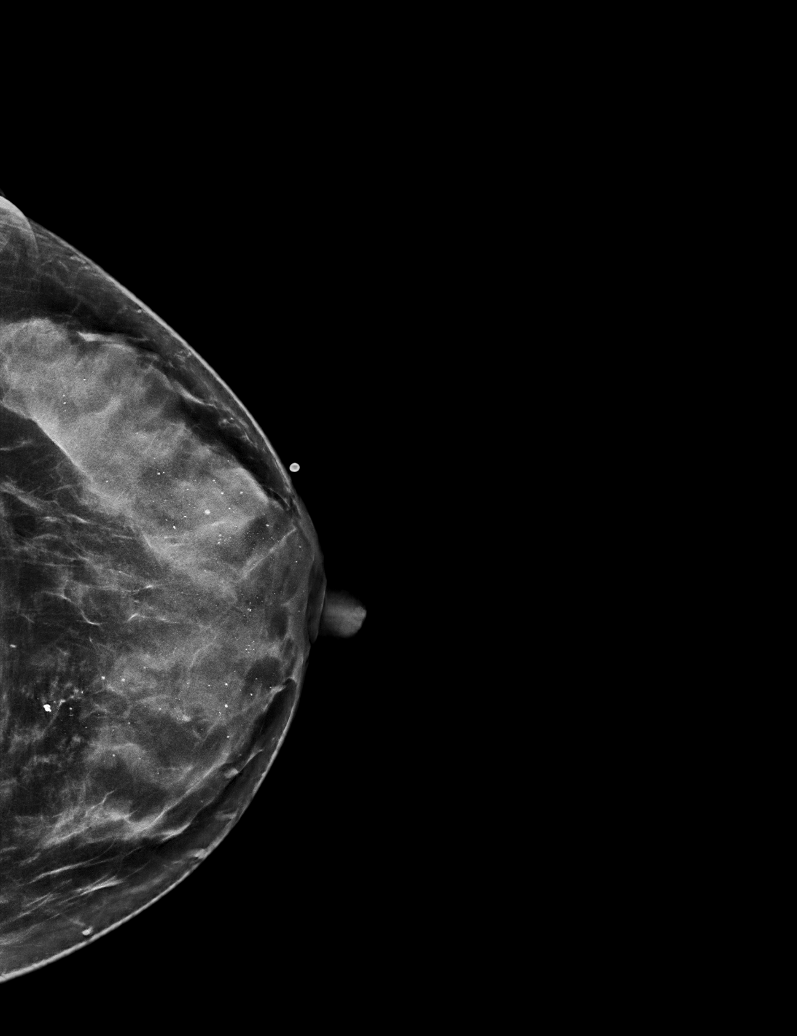

[6 of 36 positions shown; findings below may reference images not displayed]

ACR Breast Density Category d: The breast tissue is extremely dense,
which lowers the sensitivity of mammography.
FINDINGS: BBs indicating palpable sites have been placed on each breast; one
on the inferior anterior right breast, and another on the upper
outer anterior left breast. Deep to these markers, and elsewhere
scattered through the breast tissue, there are obscured oval masses
No suspicious calcifications or areas of distortion are seen in the
bilateral breasts.

Ultrasound targeted to the palpable site in the right breast at 630,
2 cm from the nipple demonstrates multiple anechoic oval
circumscribed masses. The largest at this site measures 2.5 x 1.1 x
2.0 cm.

Ultrasound targeted to the palpable site in the left breast at [DATE],
1 cm from the nipple demonstrates multiple anechoic oval
circumscribed masses, the largest measuring 2.2 x 1.4 x 1.9 cm.
IMPRESSION: 1. The palpable sites in the bilateral breasts correspond with
benign cysts.

2.  No mammographic evidence of malignancy in the bilateral breasts.

RECOMMENDATION:
Screening mammogram in one year.(Code:[id number]-[id number])

I have discussed the findings and recommendations with the patient.
If applicable, a reminder letter will be sent to the patient
regarding the next appointment.

BI-RADS CATEGORY  2: Benign.
